# Patient Record
Sex: Female | Born: 1975 | Race: Black or African American | Hispanic: No | Marital: Single | Smoking: Never smoker
Health system: Southern US, Community
[De-identification: ages and names within clinical notes are randomized; demographics above are authoritative.]

## PROBLEM LIST (undated history)

## (undated) DIAGNOSIS — D649 Anemia, unspecified: Secondary | ICD-10-CM

## (undated) DIAGNOSIS — F419 Anxiety disorder, unspecified: Secondary | ICD-10-CM

## (undated) DIAGNOSIS — F32A Depression, unspecified: Secondary | ICD-10-CM

## (undated) DIAGNOSIS — M1990 Unspecified osteoarthritis, unspecified site: Secondary | ICD-10-CM

## (undated) DIAGNOSIS — M329 Systemic lupus erythematosus, unspecified: Secondary | ICD-10-CM

## (undated) DIAGNOSIS — F329 Major depressive disorder, single episode, unspecified: Secondary | ICD-10-CM

---

## 1997-06-30 DIAGNOSIS — M329 Systemic lupus erythematosus, unspecified: Secondary | ICD-10-CM

## 1997-06-30 HISTORY — DX: Systemic lupus erythematosus, unspecified: M32.9

## 2015-05-21 DIAGNOSIS — F419 Anxiety disorder, unspecified: Secondary | ICD-10-CM | POA: Insufficient documentation

## 2015-06-04 DIAGNOSIS — Z01411 Encounter for gynecological examination (general) (routine) with abnormal findings: Secondary | ICD-10-CM | POA: Insufficient documentation

## 2015-08-10 DIAGNOSIS — R200 Anesthesia of skin: Secondary | ICD-10-CM | POA: Insufficient documentation

## 2015-10-29 HISTORY — PX: OTHER SURGICAL HISTORY: SHX169

## 2016-02-08 DIAGNOSIS — R5382 Chronic fatigue, unspecified: Secondary | ICD-10-CM | POA: Insufficient documentation

## 2016-06-09 DIAGNOSIS — M329 Systemic lupus erythematosus, unspecified: Secondary | ICD-10-CM | POA: Insufficient documentation

## 2016-06-09 DIAGNOSIS — M5136 Other intervertebral disc degeneration, lumbar region: Secondary | ICD-10-CM | POA: Insufficient documentation

## 2016-06-09 DIAGNOSIS — I7300 Raynaud's syndrome without gangrene: Secondary | ICD-10-CM | POA: Insufficient documentation

## 2016-06-09 DIAGNOSIS — N904 Leukoplakia of vulva: Secondary | ICD-10-CM | POA: Insufficient documentation

## 2016-06-09 DIAGNOSIS — B009 Herpesviral infection, unspecified: Secondary | ICD-10-CM | POA: Insufficient documentation

## 2016-07-30 ENCOUNTER — Other Ambulatory Visit: Payer: Self-pay | Admitting: Orthopedic Surgery

## 2016-07-30 DIAGNOSIS — Q762 Congenital spondylolisthesis: Secondary | ICD-10-CM

## 2016-08-04 ENCOUNTER — Other Ambulatory Visit: Payer: Self-pay

## 2016-08-06 ENCOUNTER — Ambulatory Visit
Admission: RE | Admit: 2016-08-06 | Discharge: 2016-08-06 | Disposition: A | Discharge status: Home / Self Care | Payer: Medicare Other | Source: Ambulatory Visit | Attending: Orthopedic Surgery | Admitting: Orthopedic Surgery

## 2016-08-06 DIAGNOSIS — Q762 Congenital spondylolisthesis: Secondary | ICD-10-CM

## 2016-08-12 ENCOUNTER — Other Ambulatory Visit: Payer: Self-pay | Admitting: Neurosurgery

## 2016-08-29 NOTE — Pre-Procedure Instructions (Signed)
    Linda Meyers  08/29/2016      [facility] [place], [place] - [address] [place] [address] Phone: [phone] Fax: [phone]    Your procedure is scheduled on Tues., Mar. 13 @ 0800 A.M.  [facility] Admitting at 0600 A.M.  Call this number if you have problems the morning of surgery:  [phone]   Remember:  Do not eat food or drink liquids after midnight.  Take these medicines the morning of surgery with A SIP OF WATER (none).  Stop taking and herbal medication, vitamins, Goody's. BCS, Advil, Aleve, Naproxen sodium.   Do not wear jewelry, make-up or nail polish.  Do not wear lotions, powders, or perfumes, or deoderant.  Do not shave 48 hours prior to surgery.    Do not bring valuables to the hospital.   [facility] is not responsible for any belongings or valuables.  Contacts, dentures or bridgework may not be worn into surgery.  Leave your suitcase in the car.  After surgery it may be brought to your room.  For patients admitted to the hospital, discharge time will be determined by your treatment team.  Patients discharged the day of surgery will not be allowed to drive home.   Special instructions:  See attached  Please read over the following fact sheets that you were given. Pain Booklet, Coughing and Deep Breathing, MRSA Information and Surgical Site Infection Prevention

## 2016-09-01 ENCOUNTER — Encounter (HOSPITAL_COMMUNITY)
Admission: RE | Admit: 2016-09-01 | Discharge: 2016-09-01 | Disposition: A | Discharge status: Home / Self Care | Payer: Medicare Other | Source: Ambulatory Visit | Attending: Neurosurgery | Admitting: Neurosurgery

## 2016-09-01 ENCOUNTER — Encounter (HOSPITAL_COMMUNITY): Payer: Self-pay

## 2016-09-01 DIAGNOSIS — Z01812 Encounter for preprocedural laboratory examination: Secondary | ICD-10-CM | POA: Insufficient documentation

## 2016-09-01 DIAGNOSIS — M4316 Spondylolisthesis, lumbar region: Secondary | ICD-10-CM | POA: Insufficient documentation

## 2016-09-01 HISTORY — DX: Depression, unspecified: F32.A

## 2016-09-01 HISTORY — DX: Anemia, unspecified: D64.9

## 2016-09-01 HISTORY — DX: Systemic lupus erythematosus, unspecified: M32.9

## 2016-09-01 HISTORY — DX: Anxiety disorder, unspecified: F41.9

## 2016-09-01 HISTORY — DX: Major depressive disorder, single episode, unspecified: F32.9

## 2016-09-01 HISTORY — DX: Unspecified osteoarthritis, unspecified site: M19.90

## 2016-09-01 LAB — CBC WITH DIFFERENTIAL/PLATELET
BASOS ABS: 0 10*3/uL (ref 0.0–0.1)
Basophils Relative: 0 %
EOS ABS: 0.1 10*3/uL (ref 0.0–0.7)
EOS PCT: 1 %
HCT: 41.5 % (ref 36.0–46.0)
Hemoglobin: 13.8 g/dL (ref 12.0–15.0)
Lymphocytes Relative: 18 %
Lymphs Abs: 0.9 10*3/uL (ref 0.7–4.0)
MCH: 29.6 pg (ref 26.0–34.0)
MCHC: 33.3 g/dL (ref 30.0–36.0)
MCV: 88.9 fL (ref 78.0–100.0)
Monocytes Absolute: 0.7 10*3/uL (ref 0.1–1.0)
Monocytes Relative: 14 %
Neutro Abs: 3.3 10*3/uL (ref 1.7–7.7)
Neutrophils Relative %: 67 %
PLATELETS: 359 10*3/uL (ref 150–400)
RBC: 4.67 MIL/uL (ref 3.87–5.11)
RDW: 14.2 % (ref 11.5–15.5)
WBC: 4.9 10*3/uL (ref 4.0–10.5)

## 2016-09-01 LAB — HCG, SERUM, QUALITATIVE: Preg, Serum: NEGATIVE

## 2016-09-01 LAB — TYPE AND SCREEN
ABO/RH(D): O POS
Antibody Screen: NEGATIVE

## 2016-09-01 LAB — SURGICAL PCR SCREEN
MRSA, PCR: NEGATIVE
Staphylococcus aureus: NEGATIVE

## 2016-09-01 LAB — ABO/RH: ABO/RH(D): O POS

## 2016-09-01 MED ORDER — CHLORHEXIDINE GLUCONATE CLOTH 2 % EX PADS: 6.0000 | MEDICATED_PAD | Freq: Once | CUTANEOUS | Status: DC

## 2016-09-01 NOTE — Progress Notes (Addendum)
PCP: Deneen HartsElizabeth Todd. PA-C  Cardiologist: pt denies  [facility] 08[facility]27/16 [facility]   Stress test: pt denies ever  ECHO: pt denies ever  Cardiac Cath: pt denies  Chest x-ray: pt denies past year

## 2016-09-04 DIAGNOSIS — D071 Carcinoma in situ of vulva: Secondary | ICD-10-CM | POA: Insufficient documentation

## 2016-09-04 DIAGNOSIS — Z87410 Personal history of cervical dysplasia: Secondary | ICD-10-CM | POA: Insufficient documentation

## 2016-09-08 NOTE — Anesthesia Preprocedure Evaluation (Addendum)
Anesthesia Evaluation    Airway Mallampati: II       Dental  (+) Teeth Intact   Pulmonary    breath sounds clear to auscultation       Cardiovascular  Rhythm:Regular Rate:Normal     Neuro/Psych PSYCHIATRIC DISORDERS Anxiety    GI/Hepatic   Endo/Other    Renal/GU      Musculoskeletal  (+) Arthritis ,   Abdominal   Peds  Hematology negative hematology ROS (+) anemia ,   Anesthesia Other Findings   Reproductive/Obstetrics                            Anesthesia Physical Anesthesia Plan  ASA: II  Anesthesia Plan: General   Post-op Pain Management:    Induction: Intravenous  Airway Management Planned: Oral ETT  Additional Equipment:   Intra-op Plan:   Post-operative Plan: Extubation in OR  Informed Consent: I have reviewed the patients History and Physical, chart, labs and discussed the procedure including the risks, benefits and alternatives for the proposed anesthesia with the patient or authorized representative who has indicated his/her understanding and acceptance.   Dental advisory given  Plan Discussed with: CRNA  Anesthesia Plan Comments: (Lupus (SLE))       Anesthesia Quick Evaluation

## 2016-09-09 ENCOUNTER — Inpatient Hospital Stay (HOSPITAL_COMMUNITY): Payer: Medicare Other

## 2016-09-09 ENCOUNTER — Inpatient Hospital Stay (HOSPITAL_COMMUNITY): Payer: Medicare Other | Admitting: Certified Registered Nurse Anesthetist

## 2016-09-09 ENCOUNTER — Inpatient Hospital Stay (HOSPITAL_COMMUNITY)
Admission: RE | Admit: 2016-09-09 | Discharge: 2016-09-10 | DRG: 455 | Disposition: A | Discharge status: Home / Self Care | Payer: Medicare Other | Source: Ambulatory Visit | Attending: Neurosurgery | Admitting: Neurosurgery

## 2016-09-09 ENCOUNTER — Encounter (HOSPITAL_COMMUNITY): Payer: Self-pay | Admitting: *Deleted

## 2016-09-09 ENCOUNTER — Encounter (HOSPITAL_COMMUNITY)
Admission: RE | Disposition: A | Discharge status: Home / Self Care | Payer: Self-pay | Source: Ambulatory Visit | Attending: Neurosurgery

## 2016-09-09 DIAGNOSIS — M5136 Other intervertebral disc degeneration, lumbar region: Secondary | ICD-10-CM | POA: Diagnosis present

## 2016-09-09 DIAGNOSIS — Z885 Allergy status to narcotic agent status: Secondary | ICD-10-CM

## 2016-09-09 DIAGNOSIS — M4807 Spinal stenosis, lumbosacral region: Secondary | ICD-10-CM | POA: Diagnosis present

## 2016-09-09 DIAGNOSIS — M4310 Spondylolisthesis, site unspecified: Secondary | ICD-10-CM | POA: Diagnosis present

## 2016-09-09 DIAGNOSIS — M4317 Spondylolisthesis, lumbosacral region: Secondary | ICD-10-CM | POA: Diagnosis present

## 2016-09-09 DIAGNOSIS — Z79899 Other long term (current) drug therapy: Secondary | ICD-10-CM | POA: Diagnosis not present

## 2016-09-09 DIAGNOSIS — M7130 Other bursal cyst, unspecified site: Secondary | ICD-10-CM | POA: Diagnosis present

## 2016-09-09 DIAGNOSIS — M329 Systemic lupus erythematosus, unspecified: Secondary | ICD-10-CM | POA: Diagnosis present

## 2016-09-09 DIAGNOSIS — Z888 Allergy status to other drugs, medicaments and biological substances status: Secondary | ICD-10-CM | POA: Diagnosis not present

## 2016-09-09 DIAGNOSIS — M4697 Unspecified inflammatory spondylopathy, lumbosacral region: Secondary | ICD-10-CM | POA: Diagnosis present

## 2016-09-09 DIAGNOSIS — M5126 Other intervertebral disc displacement, lumbar region: Secondary | ICD-10-CM | POA: Diagnosis present

## 2016-09-09 SURGERY — POSTERIOR LUMBAR FUSION 2 LEVEL
Anesthesia: General | Site: Back

## 2016-09-09 MED ORDER — LIDOCAINE HCL (CARDIAC) 20 MG/ML IV SOLN
INTRAVENOUS | Status: DC | PRN
  Administered 2016-09-09: 80 mg via INTRAVENOUS

## 2016-09-09 MED ORDER — LACTATED RINGERS IV SOLN
INTRAVENOUS | Status: DC | PRN
  Administered 2016-09-09 (×3): via INTRAVENOUS

## 2016-09-09 MED ORDER — THROMBIN 20000 UNITS EX SOLR
CUTANEOUS | Status: AC
  Filled 2016-09-09: qty 20000

## 2016-09-09 MED ORDER — CEFAZOLIN IN D5W 1 GM/50ML IV SOLN
1.0000 g | Freq: Three times a day (TID) | INTRAVENOUS | Status: AC
  Administered 2016-09-09 (×2): 1 g via INTRAVENOUS
  Filled 2016-09-09 (×2): qty 50

## 2016-09-09 MED ORDER — FENTANYL CITRATE (PF) 100 MCG/2ML IJ SOLN
INTRAMUSCULAR | Status: AC
  Filled 2016-09-09: qty 2

## 2016-09-09 MED ORDER — CLONAZEPAM 0.5 MG PO TABS: 1.0000 mg | ORAL_TABLET | Freq: Every evening | ORAL | Status: DC | PRN

## 2016-09-09 MED ORDER — HYDROMORPHONE HCL 2 MG PO TABS
2.0000 mg | ORAL_TABLET | ORAL | Status: DC | PRN
  Administered 2016-09-09 – 2016-09-10 (×2): 2 mg via ORAL
  Filled 2016-09-09 (×2): qty 1

## 2016-09-09 MED ORDER — PROMETHAZINE HCL 25 MG/ML IJ SOLN: 12.5000 mg | Freq: Once | INTRAMUSCULAR | Status: DC | PRN

## 2016-09-09 MED ORDER — ONDANSETRON HCL 4 MG/2ML IJ SOLN
4.0000 mg | Freq: Four times a day (QID) | INTRAMUSCULAR | Status: DC | PRN
  Administered 2016-09-09: 4 mg via INTRAVENOUS
  Filled 2016-09-09: qty 2

## 2016-09-09 MED ORDER — SUGAMMADEX SODIUM 200 MG/2ML IV SOLN
INTRAVENOUS | Status: DC | PRN
  Administered 2016-09-09: 200 mg via INTRAVENOUS

## 2016-09-09 MED ORDER — PROPOFOL 10 MG/ML IV BOLUS
INTRAVENOUS | Status: DC | PRN
  Administered 2016-09-09: 60 mg via INTRAVENOUS
  Administered 2016-09-09: 140 mg via INTRAVENOUS

## 2016-09-09 MED ORDER — SURGIFOAM 100 EX MISC
CUTANEOUS | Status: DC | PRN
  Administered 2016-09-09 (×2): via TOPICAL

## 2016-09-09 MED ORDER — MENTHOL 3 MG MT LOZG: 1.0000 | LOZENGE | OROMUCOSAL | Status: DC | PRN

## 2016-09-09 MED ORDER — VITAMIN A 8000 UNITS PO CAPS: 8000.0000 [IU] | ORAL_CAPSULE | Freq: Every evening | ORAL | Status: DC

## 2016-09-09 MED ORDER — ROCURONIUM BROMIDE 100 MG/10ML IV SOLN
INTRAVENOUS | Status: DC | PRN
  Administered 2016-09-09: 10 mg via INTRAVENOUS
  Administered 2016-09-09: 20 mg via INTRAVENOUS
  Administered 2016-09-09: 40 mg via INTRAVENOUS
  Administered 2016-09-09: 20 mg via INTRAVENOUS
  Administered 2016-09-09: 10 mg via INTRAVENOUS

## 2016-09-09 MED ORDER — HYDROMORPHONE HCL 1 MG/ML IJ SOLN
0.5000 mg | INTRAMUSCULAR | Status: DC | PRN
  Administered 2016-09-09: 1 mg via INTRAVENOUS
  Filled 2016-09-09: qty 1

## 2016-09-09 MED ORDER — PHENOL 1.4 % MT LIQD: 1.0000 | OROMUCOSAL | Status: DC | PRN

## 2016-09-09 MED ORDER — SODIUM CHLORIDE 0.9 % IJ SOLN
INTRAMUSCULAR | Status: AC
  Filled 2016-09-09: qty 10

## 2016-09-09 MED ORDER — VANCOMYCIN HCL 1000 MG IV SOLR
INTRAVENOUS | Status: AC
  Filled 2016-09-09: qty 1000

## 2016-09-09 MED ORDER — SODIUM CHLORIDE 0.9 % IV SOLN: 250.0000 mL | INTRAVENOUS | Status: DC

## 2016-09-09 MED ORDER — GRAPESEED EXTRACT 500-50 MG PO CAPS: 500.0000 mg | ORAL_CAPSULE | Freq: Every evening | ORAL | Status: DC

## 2016-09-09 MED ORDER — CEFAZOLIN SODIUM-DEXTROSE 2-4 GM/100ML-% IV SOLN
2.0000 g | INTRAVENOUS | Status: AC
  Administered 2016-09-09: 2 g via INTRAVENOUS
  Filled 2016-09-09: qty 100

## 2016-09-09 MED ORDER — IMIQUIMOD 5 % EX CREA: 1.0000 "application " | TOPICAL_CREAM | CUTANEOUS | Status: DC

## 2016-09-09 MED ORDER — ONDANSETRON HCL 4 MG/2ML IJ SOLN
INTRAMUSCULAR | Status: DC | PRN
  Administered 2016-09-09: 4 mg via INTRAVENOUS

## 2016-09-09 MED ORDER — SUCCINYLCHOLINE CHLORIDE 200 MG/10ML IV SOSY
PREFILLED_SYRINGE | INTRAVENOUS | Status: AC
  Filled 2016-09-09: qty 10

## 2016-09-09 MED ORDER — VITAMIN D 1000 UNITS PO TABS
1000.0000 [IU] | ORAL_TABLET | Freq: Every evening | ORAL | Status: DC
  Administered 2016-09-09: 1000 [IU] via ORAL
  Filled 2016-09-09: qty 1

## 2016-09-09 MED ORDER — VECURONIUM BROMIDE 10 MG IV SOLR
INTRAVENOUS | Status: DC | PRN
  Administered 2016-09-09: 1 mg via INTRAVENOUS

## 2016-09-09 MED ORDER — POLYVINYL ALCOHOL 1.4 % OP SOLN
1.0000 [drp] | Freq: Three times a day (TID) | OPHTHALMIC | Status: DC | PRN
  Filled 2016-09-09: qty 15

## 2016-09-09 MED ORDER — MIDAZOLAM HCL 2 MG/2ML IJ SOLN
INTRAMUSCULAR | Status: AC
  Filled 2016-09-09: qty 2

## 2016-09-09 MED ORDER — ONDANSETRON HCL 4 MG PO TABS: 4.0000 mg | ORAL_TABLET | Freq: Four times a day (QID) | ORAL | Status: DC | PRN

## 2016-09-09 MED ORDER — DIAZEPAM 5 MG PO TABS: 5.0000 mg | ORAL_TABLET | Freq: Four times a day (QID) | ORAL | Status: DC | PRN

## 2016-09-09 MED ORDER — MIDAZOLAM HCL 5 MG/5ML IJ SOLN
INTRAMUSCULAR | Status: DC | PRN
  Administered 2016-09-09: 2 mg via INTRAVENOUS

## 2016-09-09 MED ORDER — BUPIVACAINE HCL (PF) 0.25 % IJ SOLN
INTRAMUSCULAR | Status: DC | PRN
  Administered 2016-09-09: 20 mL

## 2016-09-09 MED ORDER — 0.9 % SODIUM CHLORIDE (POUR BTL) OPTIME
TOPICAL | Status: DC | PRN
  Administered 2016-09-09: 1000 mL

## 2016-09-09 MED ORDER — EPHEDRINE 5 MG/ML INJ
INTRAVENOUS | Status: AC
  Filled 2016-09-09: qty 10

## 2016-09-09 MED ORDER — MEPERIDINE HCL 25 MG/ML IJ SOLN: 6.2500 mg | INTRAMUSCULAR | Status: DC | PRN

## 2016-09-09 MED ORDER — COQ10 100 MG PO CAPS
100.0000 mg | ORAL_CAPSULE | Freq: Every evening | ORAL | Status: DC
Start: 2016-09-09 — End: 2016-09-09

## 2016-09-09 MED ORDER — SODIUM CHLORIDE 0.9% FLUSH
3.0000 mL | Freq: Two times a day (BID) | INTRAVENOUS | Status: DC
  Administered 2016-09-09: 3 mL via INTRAVENOUS

## 2016-09-09 MED ORDER — VITAMIN C 500 MG PO TABS
500.0000 mg | ORAL_TABLET | Freq: Every evening | ORAL | Status: DC
  Administered 2016-09-09: 500 mg via ORAL
  Filled 2016-09-09 (×2): qty 1

## 2016-09-09 MED ORDER — PROPOFOL 10 MG/ML IV BOLUS
INTRAVENOUS | Status: AC
  Filled 2016-09-09: qty 20

## 2016-09-09 MED ORDER — VECURONIUM BROMIDE 10 MG IV SOLR
INTRAVENOUS | Status: AC
  Filled 2016-09-09: qty 10

## 2016-09-09 MED ORDER — BUPIVACAINE HCL (PF) 0.25 % IJ SOLN
INTRAMUSCULAR | Status: AC
  Filled 2016-09-09: qty 30

## 2016-09-09 MED ORDER — NAPROXEN 250 MG PO TABS: 500.0000 mg | ORAL_TABLET | Freq: Every evening | ORAL | Status: DC | PRN

## 2016-09-09 MED ORDER — DEXAMETHASONE SODIUM PHOSPHATE 10 MG/ML IJ SOLN
10.0000 mg | INTRAMUSCULAR | Status: AC
  Administered 2016-09-09: 10 mg via INTRAVENOUS
  Filled 2016-09-09: qty 1

## 2016-09-09 MED ORDER — FENTANYL CITRATE (PF) 100 MCG/2ML IJ SOLN
25.0000 ug | INTRAMUSCULAR | Status: DC | PRN
  Administered 2016-09-09 (×3): 50 ug via INTRAVENOUS

## 2016-09-09 MED ORDER — FENTANYL CITRATE (PF) 100 MCG/2ML IJ SOLN
INTRAMUSCULAR | Status: DC | PRN
  Administered 2016-09-09 (×4): 50 ug via INTRAVENOUS
  Administered 2016-09-09: 100 ug via INTRAVENOUS
  Administered 2016-09-09 (×2): 50 ug via INTRAVENOUS

## 2016-09-09 MED ORDER — MIDAZOLAM HCL 2 MG/2ML IJ SOLN: 0.5000 mg | Freq: Once | INTRAMUSCULAR | Status: DC | PRN

## 2016-09-09 MED ORDER — NAPROXEN SODIUM 550 MG PO TABS
550.0000 mg | ORAL_TABLET | Freq: Every evening | ORAL | Status: DC | PRN
  Filled 2016-09-09: qty 1

## 2016-09-09 MED ORDER — FENTANYL CITRATE (PF) 100 MCG/2ML IJ SOLN
INTRAMUSCULAR | Status: AC
  Filled 2016-09-09: qty 4

## 2016-09-09 MED ORDER — FOLIC ACID 1 MG PO TABS
1.0000 mg | ORAL_TABLET | Freq: Every evening | ORAL | Status: DC
  Administered 2016-09-09: 1 mg via ORAL
  Filled 2016-09-09: qty 1

## 2016-09-09 MED ORDER — LACTATED RINGERS IV SOLN: INTRAVENOUS | Status: DC

## 2016-09-09 MED ORDER — SODIUM CHLORIDE 0.9% FLUSH: 3.0000 mL | INTRAVENOUS | Status: DC | PRN

## 2016-09-09 MED ORDER — ONDANSETRON HCL 4 MG/2ML IJ SOLN
INTRAMUSCULAR | Status: AC
  Filled 2016-09-09: qty 2

## 2016-09-09 MED ORDER — ADULT MULTIVITAMIN W/MINERALS CH
1.0000 | ORAL_TABLET | Freq: Every evening | ORAL | Status: DC
  Administered 2016-09-09: 1 via ORAL
  Filled 2016-09-09: qty 1

## 2016-09-09 MED ORDER — HYDROXYZINE HCL 50 MG/ML IM SOLN
25.0000 mg | Freq: Four times a day (QID) | INTRAMUSCULAR | Status: DC | PRN
  Administered 2016-09-09: 25 mg via INTRAMUSCULAR
  Filled 2016-09-09: qty 1

## 2016-09-09 MED ORDER — FLAXSEED OIL 1000 MG PO CAPS: 1000.0000 mg | ORAL_CAPSULE | Freq: Every evening | ORAL | Status: DC

## 2016-09-09 MED ORDER — SODIUM CHLORIDE 0.9 % IR SOLN
Status: DC | PRN
  Administered 2016-09-09: 09:00:00

## 2016-09-09 MED FILL — Sodium Chloride IV Soln 0.9%: INTRAVENOUS | Qty: 1000 | Status: AC

## 2016-09-09 MED FILL — Heparin Sodium (Porcine) Inj 1000 Unit/ML: INTRAMUSCULAR | Qty: 30 | Status: AC

## 2016-09-09 SURGICAL SUPPLY — 66 items
BAG DECANTER FOR FLEXI CONT (MISCELLANEOUS) ×2 IMPLANT
BENZOIN TINCTURE PRP APPL 2/3 (GAUZE/BANDAGES/DRESSINGS) ×2 IMPLANT
BLADE CLIPPER SURG (BLADE) IMPLANT
BUR CUTTER 7.0 ROUND (BURR) IMPLANT
BUR MATCHSTICK NEURO 3.0 LAGG (BURR) ×2 IMPLANT
CANISTER SUCT 3000ML PPV (MISCELLANEOUS) ×2 IMPLANT
CAP LCK SPNE (Orthopedic Implant) ×6 IMPLANT
CAP LOCK SPINE RADIUS (Orthopedic Implant) ×6 IMPLANT
CAP LOCKING (Orthopedic Implant) ×6 IMPLANT
CARTRIDGE OIL MAESTRO DRILL (MISCELLANEOUS) ×1 IMPLANT
CONT SPEC 4OZ CLIKSEAL STRL BL (MISCELLANEOUS) ×2 IMPLANT
COVER BACK TABLE 60X90IN (DRAPES) ×2 IMPLANT
DECANTER SPIKE VIAL GLASS SM (MISCELLANEOUS) ×2 IMPLANT
DERMABOND ADVANCED (GAUZE/BANDAGES/DRESSINGS) ×1
DERMABOND ADVANCED .7 DNX12 (GAUZE/BANDAGES/DRESSINGS) ×1 IMPLANT
DEVICE INTERBODY ELEVATE 23X8 (Cage) ×4 IMPLANT
DIFFUSER DRILL AIR PNEUMATIC (MISCELLANEOUS) ×2 IMPLANT
DRAPE C-ARM 42X72 X-RAY (DRAPES) ×4 IMPLANT
DRAPE HALF SHEET 40X57 (DRAPES) IMPLANT
DRAPE LAPAROTOMY 100X72X124 (DRAPES) ×2 IMPLANT
DRAPE POUCH INSTRU U-SHP 10X18 (DRAPES) ×2 IMPLANT
DRAPE SURG 17X23 STRL (DRAPES) ×8 IMPLANT
DRSG OPSITE POSTOP 4X8 (GAUZE/BANDAGES/DRESSINGS) ×2 IMPLANT
DURAPREP 26ML APPLICATOR (WOUND CARE) ×2 IMPLANT
ELECT REM PT RETURN 9FT ADLT (ELECTROSURGICAL) ×2
ELECTRODE REM PT RTRN 9FT ADLT (ELECTROSURGICAL) ×1 IMPLANT
EVACUATOR 1/8 PVC DRAIN (DRAIN) ×2 IMPLANT
GAUZE SPONGE 4X4 12PLY STRL (GAUZE/BANDAGES/DRESSINGS) ×2 IMPLANT
GAUZE SPONGE 4X4 16PLY XRAY LF (GAUZE/BANDAGES/DRESSINGS) IMPLANT
GLOVE ECLIPSE 9.0 STRL (GLOVE) ×4 IMPLANT
GLOVE EXAM NITRILE LRG STRL (GLOVE) IMPLANT
GLOVE EXAM NITRILE XL STR (GLOVE) IMPLANT
GLOVE EXAM NITRILE XS STR PU (GLOVE) IMPLANT
GOWN STRL REUS W/ TWL LRG LVL3 (GOWN DISPOSABLE) IMPLANT
GOWN STRL REUS W/ TWL XL LVL3 (GOWN DISPOSABLE) ×4 IMPLANT
GOWN STRL REUS W/TWL 2XL LVL3 (GOWN DISPOSABLE) IMPLANT
GOWN STRL REUS W/TWL LRG LVL3 (GOWN DISPOSABLE)
GOWN STRL REUS W/TWL XL LVL3 (GOWN DISPOSABLE) ×4
KIT BASIN OR (CUSTOM PROCEDURE TRAY) ×2 IMPLANT
KIT ROOM TURNOVER OR (KITS) ×2 IMPLANT
NEEDLE HYPO 22GX1.5 SAFETY (NEEDLE) ×2 IMPLANT
NS IRRIG 1000ML POUR BTL (IV SOLUTION) ×2 IMPLANT
OIL CARTRIDGE MAESTRO DRILL (MISCELLANEOUS) ×2
PACK LAMINECTOMY NEURO (CUSTOM PROCEDURE TRAY) ×2 IMPLANT
ROD 70MM (Rod) ×1 IMPLANT
ROD 80MM (Rod) ×1 IMPLANT
ROD SPNL 70X5.5XNS TI RDS (Rod) ×1 IMPLANT
ROD SPNL 80X5.5XNS TI RDS (Rod) ×1 IMPLANT
SCREW 5.75X40M (Screw) ×4 IMPLANT
SCREW 5.75X45MM (Screw) ×4 IMPLANT
SCREW 6.75X40MM (Screw) ×4 IMPLANT
SPACER SPNL STD 23X8XSTRL (Cage) ×2 IMPLANT
SPACER SPNL XLORDOTIC 23X8X (Cage) ×2 IMPLANT
SPCR SPNL STD 23X8XSTRL (Cage) ×2 IMPLANT
SPCR SPNL XLORDOTIC 23X8X (Cage) ×2 IMPLANT
SPONGE LAP 4X18 X RAY DECT (DISPOSABLE) ×2 IMPLANT
SPONGE SURGIFOAM ABS GEL 100 (HEMOSTASIS) ×2 IMPLANT
STRIP CLOSURE SKIN 1/2X4 (GAUZE/BANDAGES/DRESSINGS) ×2 IMPLANT
SUT VIC AB 0 CT1 18XCR BRD8 (SUTURE) ×1 IMPLANT
SUT VIC AB 0 CT1 8-18 (SUTURE) ×1
SUT VIC AB 2-0 CT1 18 (SUTURE) ×2 IMPLANT
SUT VIC AB 3-0 SH 8-18 (SUTURE) ×4 IMPLANT
TOWEL GREEN STERILE (TOWEL DISPOSABLE) ×2 IMPLANT
TOWEL GREEN STERILE FF (TOWEL DISPOSABLE) ×2 IMPLANT
TRAY FOLEY W/METER SILVER 16FR (SET/KITS/TRAYS/PACK) ×2 IMPLANT
WATER STERILE IRR 1000ML POUR (IV SOLUTION) ×2 IMPLANT

## 2016-09-09 NOTE — H&P (Signed)
Linda Meyers is an 41 y.o. female.   Chief Complaint: Pain HPI: 41 year old female with progressively worsening lower back pain with radiation of both lower extremities right greater than left. She has increasing pain with standing or ambulating. She is failed conservative management. Workup demonstrates evidence of marked disc degeneration with severe facet arthropathy and early grade 1 degenerative spondylolisthesis at L5-S1 with resultant stenosis. At L4-5 she has disc degeneration with a central annular tear. Patient presents now for two-level lumbar decompression infusion in hopes of improving her symptoms.  Past Medical History:  Diagnosis Date  . Anemia   . Anxiety   . Arthritis   . Depression   . Lupus (systemic lupus erythematosus) (HCC) 1999    Past Surgical History:  Procedure Laterality Date  . vulva vaporization  10/2015    History reviewed. No pertinent family history. Social History:  reports that she has never smoked. She has never used smokeless tobacco. She reports that she drinks alcohol. She reports that she uses drugs, including Marijuana, about 7 times per week.  Allergies:  Allergies  Allergen Reactions  . Lortab [Hydrocodone-Acetaminophen] Itching and Nausea And Vomiting    "NO OPOID"  . Percocet [Oxycodone-Acetaminophen] Nausea And Vomiting    "NO OPOID"  . Propoxyphene Nausea And Vomiting    Medications Prior to Admission  Medication Sig Dispense Refill  . cholecalciferol (VITAMIN D) 1000 units tablet Take 1,000 Units by mouth every evening.    . clonazePAM (KLONOPIN) 1 MG tablet Take 1 tablet by mouth at bedtime as needed. For heightened anxiety at night to induce sleep.    . Coenzyme Q10 (COQ10) 100 MG CAPS Take 100 mg by mouth every evening.    . diphenhydramine-acetaminophen (TYLENOL PM) 25-500 MG TABS tablet Take 1 tablet by mouth at bedtime as needed (for pain).    . Flaxseed, Linseed, (FLAXSEED OIL) 1000 MG CAPS Take 1,000 mg by mouth every  evening.    . folic acid (FOLVITE) 400 MCG tablet Take 400 mcg by mouth every evening.    . imiquimod (ALDARA) 5 % cream Apply 1 application topically every other day.    . Multiple Vitamins-Minerals (ANTIOXIDANT VITAMIN/MINERAL) CAPS Take 1 capsule by mouth every evening.    . naproxen sodium (ANAPROX) 220 MG tablet Take 440 mg by mouth at bedtime as needed (for pain).    . Nutritional Supplements (GRAPESEED EXTRACT) 500-50 MG CAPS Take 500 mg by mouth every evening.    . Omega-3 Fatty Acids (FISH OIL) 1000 MG CAPS Take 1,000 mg by mouth every evening.    . polyvinyl alcohol (LUBRICANT DROPS) 1.4 % ophthalmic solution Place 1-2 drops into both eyes 3 (three) times daily as needed for dry eyes.    . vitamin A 8000 UNIT capsule Take 8,000 Units by mouth every evening.    . vitamin C (ASCORBIC ACID) 500 MG tablet Take 500 mg by mouth every evening.    Marland Kitchen VALTREX 1 g tablet Take 1 g by mouth daily as needed. For outbreaks.      No results found for this or any previous visit (from the past 48 hour(s)). No results found.  Pertinent items noted in HPI and remainder of comprehensive ROS otherwise negative.  Blood pressure 114/63, pulse 73, temperature 98.3 F (36.8 C), temperature source Oral, resp. rate 18, height 5\' 8"  (1.727 m), weight 73.6 kg (162 lb 5 oz), last menstrual period 08/25/2016, SpO2 98 %.  Patient is awake and alert. Oriented and appropriate. Speech is fluent.  Judgment and insight are intact. Cranial nerve function normal bilaterally. Motor and sensory function extremities normal. Gait antalgic. Posture flexed. Straight leg raising mildly positive on right. Reflexes normal except right Achilles absent and left Achilles diminished. Examination head ears eyes and throat is unremarkable chest and abdomen are benign. Extremities are free from injury deformity. 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 L5-S1 grade 1 degenerative  spondylolisthesis with severe stenosis and marked facet arthropathy. Plan bilateral L4-5 and L5-S1 decompressive laminotomies and foraminotomies followed by posterior lumbar my fusion utilizing interbody expandable cages, locally harvested autograft, and augmented with posterior lateral arthrodesis utilizing segmental pedicle screw fixation and local autografting. Risks metaphyseal been explained. Patient wishes to proceed.  Kyng Matlock A 09/09/2016, 7:52 AM

## 2016-09-09 NOTE — Transfer of Care (Signed)
Immediate Anesthesia Transfer of Care Note  Patient: Linda Meyers  Procedure(s) Performed: Procedure(s) with comments: Posterior Lumbar Interbody Fusion - Lumbar four -Lumbar five - Lumbar five-Sacral one (N/A) - Posterior Lumbar Interbody Fusion - Lumbar four -Lumbar five - Lumbar five-Sacral one  Patient Location: PACU  Anesthesia Type:General  Level of Consciousness: awake, alert  and oriented  Airway & Oxygen Therapy: Patient Spontanous Breathing and Patient connected to face mask oxygen  Post-op Assessment: Report given to RN, Post -op Vital signs reviewed and stable and Patient moving all extremities X 4  Post vital signs: Reviewed and stable  Last Vitals:  Vitals:   09/09/16 0618  BP: 114/63  Pulse: 73  Resp: 18  Temp: 36.8 C    Last Pain:  Vitals:   09/09/16 0618  TempSrc: Oral         Complications: No apparent anesthesia complications

## 2016-09-09 NOTE — Brief Op Note (Signed)
09/09/2016  11:52 AM  PATIENT:  Linda Meyers  41 y.o. female  PRE-OPERATIVE DIAGNOSIS:  Degenerative spondylolisthesis  POST-OPERATIVE DIAGNOSIS:  Degenerative spondylolisthesis  PROCEDURE:  Procedure(s) with comments: Posterior Lumbar Interbody Fusion - Lumbar four -Lumbar five - Lumbar five-Sacral one (N/A) - Posterior Lumbar Interbody Fusion - Lumbar four -Lumbar five - Lumbar five-Sacral one  SURGEON:  Surgeon(s) and Role:    * Julio SicksHenry Bogdan Vivona, MD - Primary    * Maeola HarmanJoseph Stern, MD - Assisting  PHYSICIAN ASSISTANT:   ASSISTANTS:    ANESTHESIA:   general  EBL:  Total I/O In: 2000 [I.V.:2000] Out: 1145 [Urine:745; Blood:400]  BLOOD ADMINISTERED:none  DRAINS: none   LOCAL MEDICATIONS USED:  MARCAINE     SPECIMEN:  No Specimen  DISPOSITION OF SPECIMEN:  N/A  COUNTS:  YES  TOURNIQUET:  * No tourniquets in log *  DICTATION: .Dragon Dictation  PLAN OF CARE: Admit to inpatient   PATIENT DISPOSITION:  PACU - hemodynamically stable.   Delay start of Pharmacological VTE agent (>24hrs) due to surgical blood loss or risk of bleeding: yes

## 2016-09-09 NOTE — Op Note (Signed)
Date of procedure: 09/09/2016  Date of dictation: Same  Service: Neurosurgery  Preoperative diagnosis: L5-S1 degenerative spondylolisthesis with stenosis, L4-5 degenerative disc disease with central disc herniation  Postoperative diagnosis: Same  Procedure Name: Bilateral L4-5 and L5-S1 decompressive laminotomies and foraminotomies, more than would be required for simple interbody fusion alone.  L4-5 and L5-S1 posterior lumbar interbody fusion utilizing interbody expandable cages and locally harvested autograft  L4-5 and S1 posterior lateral arthrodesis utilizing segmental pedicle screw fixation and local autograft  Surgeon:Solon Alban A.Dajohn Ellender, M.D.  Asst. Surgeon: Venetia MaxonStern  Anesthesia: General  Indication: Patient is a 41 year old female with intractable back and lower extremity pain failing conservative management. Workup demonstrates evidence of marked disc degeneration with a central disc herniation L4-5 and severe facet arthropathy with bilateral synovial cyst and severe stenosis and an associated grade 1 degenerative spondylolisthesis at L5-S1. Patient has failed conservative management presents now for two-level lumbar decompression and fusion.  Operative note: After induction anesthesia, patient position prone onto Wilson frame appropriate padded. Lumbar region prepped and draped sterilely. Incision made overlying L4-5 and S1. Dissection performed bilaterally. Retractor placed. Fluoroscopy used. Levels confirmed. Decompressive laminotomies were performed bilaterally at L4-5 and L5-S1 removing the inferior aspect of lamina of L4 and L5 the superior aspect lamina of L5 and S1 the entire inferior facet and pars of L4 and L5 and the majority the superior facet of L5 and S1. Ligament flavum and pannus were elevated and resected. Foraminotomies were performed along the course exiting L4-5 and S1 nerve roots. Bilateral discectomies performed at L4-5 and L5-S1. Disc space prepared for interbody fusion.  With the distractor left and patient's contralateral side a 8 mm standard [employer] expandable cage packed with locally harvested autograft was then impacted into place and expanded to its full extent. The distractor was removed patient's right side. Disc space once again prepared for interbody fusion. Morselize autograft packed in the interspace and then a second cage packed with autograft was then impacted in place and expanded to its full extent. Procedure repeated L5-S1 using 8 mm extra lordotic imp   which were once again fully expanded. Pedicles of L4-5 and S1 were then applied using surface landmarks and intraoperative fluoroscopy. Superficial bone Pedicles removed using high-speed drill. Each pedicles and probed with pedicle awl each pedicle awl track was confirmed to be solidly within the bone. Each pedicle awl track was tapped and then 5.75 mm radius brand screws from [employer] were placed bilaterally at L4 and L5. 6.75 screws placed bilaterally at S1. Transverse processes and residual facets were decorticated. Morselize autograft was packed posterior laterally. Final images revealed good position of the cages and the pedicle screws at the proper upper level with normal alignment spine. Short segment titanium rod placed over the screw heads at L4-5 and S1. Locking caps placed over the screws. Locking caps engaged with the construct in compression. Gelfoam placed topically for hemostasis. Vancomycin powder placed the deep wound space. Wounds and close in layers of Vicryl sutures. Steri-Strips and sterile dressing were applied. No apparent complications. Patient tolerated the procedure well and she returns to the recovery room postop.

## 2016-09-09 NOTE — Progress Notes (Signed)
Dr. Duwaine MaxinMukesh wants HCG done today I stat order placed.

## 2016-09-09 NOTE — Anesthesia Procedure Notes (Signed)
Procedure Name: Intubation Date/Time: 09/09/2016 8:11 AM Performed by: Lanell MatarBAKER, Kerisha Goughnour M Pre-anesthesia Checklist: Patient identified, Emergency Drugs available, Suction available and Patient being monitored Patient Re-evaluated:Patient Re-evaluated prior to inductionOxygen Delivery Method: Circle System Utilized Preoxygenation: Pre-oxygenation with 100% oxygen Intubation Type: IV induction Ventilation: Mask ventilation without difficulty Laryngoscope Size: Miller and 2 Grade View: Grade I Tube type: Oral Tube size: 7.0 mm Number of attempts: 1 Airway Equipment and Method: Stylet and Oral airway Placement Confirmation: ETT inserted through vocal cords under direct vision,  positive ETCO2 and breath sounds checked- equal and bilateral Secured at: 22 cm Tube secured with: Tape Dental Injury: Teeth and Oropharynx as per pre-operative assessment

## 2016-09-10 LAB — I-STAT BETA HCG BLOOD, ED (NOT ORDERABLE): I-stat hCG, quantitative: <5 m[IU]/mL (ref <5)

## 2016-09-10 MED ORDER — DIAZEPAM 5 MG PO TABS: 5.0000 mg | ORAL_TABLET | Freq: Four times a day (QID) | ORAL | 0 refills | Status: DC | PRN

## 2016-09-10 MED ORDER — HYDROMORPHONE HCL 2 MG PO TABS: 2.0000 mg | ORAL_TABLET | ORAL | 0 refills | Status: DC | PRN

## 2016-09-10 MED FILL — Thrombin For Soln 20000 Unit: CUTANEOUS | Qty: 1 | Status: AC

## 2016-09-10 NOTE — Addendum Note (Signed)
Addendum  created 09/10/16 1357 by Arma HeadingMukesh Stephanine Reas, MD   Sign clinical note

## 2016-09-10 NOTE — Anesthesia Postprocedure Evaluation (Signed)
Anesthesia Post Note  Patient: Linda Meyers  Procedure(s) Performed: Procedure(s) (LRB): Posterior Lumbar Interbody Fusion - Lumbar four -Lumbar five - Lumbar five-Sacral one (N/A)  Patient location during evaluation: PACU Anesthesia Type: General Level of consciousness: awake Pain management: pain level controlled Vital Signs Assessment: post-procedure vital signs reviewed and stable Cardiovascular status: stable Postop Assessment: no signs of nausea or vomiting Anesthetic complications: no       Last Vitals:  Vitals:   09/10/16 0338 09/10/16 0833  BP: 94/66 109/62  Pulse: 94 85  Resp: 18 18  Temp: 37.4 C 37.2 C    Last Pain:  Vitals:   09/10/16 0833  TempSrc: Oral  PainSc:                  [name]

## 2016-09-10 NOTE — Discharge Instructions (Signed)
Spinal Fusion, Adult, Care After This sheet gives you information about how to care for yourself after your procedure. Your health care provider may also give you more specific instructions. If you have problems or questions, contact your health care provider. What can I expect after the procedure? After the procedure, it is common to have:  Back pain and stiffness.  Pain in the incision area. Follow these instructions at home: Medicines   Take over-the-counter and prescription medicines only as told by your health care provider. These include any pain medicines or blood thinning medicines (anticoagulants).  If you were prescribed an antibiotic medicine, take it as told by your health care provider. Do not stop taking the antibiotic even if you start to feel better.  Do not drive for 24 hours if you received a medicine to help you relax (sedative).  Do not drive or use heavy machinery while taking prescription pain medicine. If you have a brace:   Wear the brace as told by your health care provider. Remove it only as told by your health care provider.  Keep the brace clean. Managing pain, stiffness, and swelling   If directed, put ice on the injured area:  If you have a removable brace, remove it as told by your health care provider.  Put ice in a plastic bag.  Place a towel between your skin and the bag.  Leave the ice on for 20 minutes, 2-3 times a day. Incision care   Follow instructions from your health care provider about how to take care of your incision. Make sure you:  Wash your hands with soap and water before you change your bandage (dressing). If soap and water are not available, use hand sanitizer.  Change your dressing as told by your health care provider.  Leave stitches (sutures), skin glue, or adhesive strips in place. These skin closures may need to be in place for 2 weeks or longer. If adhesive strip edges start to loosen and curl up, you may trim the  loose edges. Do not remove adhesive strips completely unless your health care provider tells you to do that.  Keep your incision clean and dry. Do not take baths, swim, or use a hot tub until your health care provider approves.  Check your incision area every day for signs of infection. Check for:  More redness, swelling, or pain.  Fluid or blood.  Warmth.  Pus or a bad smell. Physical activity   Rest and protect your back as much as possible.  Follow instructions from your health care provider about how to move and use good posture to help your spine heal.  Do not lift anything that is heavier than 8 lb (3.6 kg) or as told by your health care provider.  Do not twist or bend at the waist until your health care provider approves.  Avoid:  Pushing and pulling motions.  Lifting anything over your head.  Sitting or lying down in the same position for long periods of time.  Do not exercise until your health care provider approves. Once your health care provider has approved exercise, ask him or her what kinds of exercises you can do to make your back stronger (physical therapy). General instructions   Wear compression stockings and walk at least every few hours as told by your health care provider. Doing this will help to prevent blood clots and reduce swelling in your legs.  Do not use any products that contain nicotine or tobacco, such  as cigarettes and e-cigarettes. These can delay bone healing. If you need help quitting, ask your health care provider.  To prevent or treat constipation while you are taking prescription pain medicine, your health care provider may recommend that you:  Drink enough fluid to keep your urine clear or pale yellow.  Take over-the-counter or prescription medicines.  Eat foods that are high in fiber, such as fresh fruits and vegetables, whole grains, and beans.  Limit foods that are high in fat and processed sugars, such as fried and sweet  foods.  Keep all follow-up visits as told by your health care provider. This is important. Contact a health care provider if:  You have pain that gets worse or does not get better with medicine.  Your legs or feet become painful or swollen.  You have more redness, swelling, or pain around your incision.  You have fluid or blood coming from your incision.  Your incision feels warm to the touch.  You have pus or a bad smell coming from your incision.  You have a fever.  You vomit or feel nausea.  You have weakness or numbness in your legs that is new or getting worse.  You have trouble controlling urination or bowel movements. Get help right away if:  You have severe pain.  You have chest pain.  You have trouble breathing.  You develop a cough. These symptoms may represent a serious problem that is an emergency. Do not wait to see if the symptoms will go away. Get medical help right away. Call your local emergency services (911 in the [place] Do not drive yourself to the hospital. Summary  It is common to have pain at the back and incision area.  Icing and pain medicines may help to control the pain. Follow directions from your health care provider.  Rest and protect your back as much as possible. Do not twist or bend at the waist. Get up and walk at least every few hours as told by your health care provider. This information is not intended to replace advice given to you by your health care provider. Make sure you discuss any questions you have with your health care provider. Document Released: 01/03/2005 Document Revised: 06/04/2016 Document Reviewed: 06/04/2016 Elsevier Interactive Patient Education  2017 [employer] Wound Care Keep incision covered and dry for two days.  If you shower, cover incision with plastic wrap.  Do not put any creams, lotions, or ointments on incision. Leave steri-strips on back.  They will fall off by themselves. Activity Walk each and  every day, increasing distance each day. No lifting greater than 5 lbs.  Avoid excessive neck motion. No driving for 2 weeks; may ride as a passenger locally. If provided with back brace, wear when out of bed.  It is not necessary to wear brace in bed. Diet Resume your normal diet.  Return to Work Will be discussed at you follow up appointment. Call Your Doctor If Any of These Occur Redness, drainage, or swelling at the wound.  Temperature greater than 101 degrees. Severe pain not relieved by pain medication. Incision starts to come apart. Follow Up Appt Call today for appointment in 1-2 weeks ([phone]) or for problems.  If you have any hardware placed in your spine, you will need an x-ray before your appointment.

## 2016-09-10 NOTE — Evaluation (Signed)
Physical Therapy Evaluation Patient Details Name: Linda Meyers MRN: [id number] DOB: 01/01/1976 Today's Date: 09/10/2016   History of Present Illness  41 y.o. female with degenerative spondylolisthesis of L4-L5 and L5-S1. S/p L4-L5 and L5-S1 laminotomies with fusion  Clinical Impression  Pt presents with decreased strength, balance and activity tolerance secondary to above. PTA pt was I with all mobility. Recommend d/c home as pt is functioning at supervision level with mobility. Pt would benefit from continued acute PT services to address balance and improve activity tolerance. Acute PT will follow as able.     Follow Up Recommendations No PT follow up    Equipment Recommendations  None recommended by PT    Recommendations for Other Services       Precautions / Restrictions Precautions Precautions: Back;Fall Precaution Booklet Issued: Yes (comment) Precaution Comments: reviewed handout and able to 100% recall precautions Required Braces or Orthoses: Spinal Brace Spinal Brace: Applied in sitting position;Thoracolumbosacral orthotic ([facility]) Restrictions Weight Bearing Restrictions: No Other Position/Activity Restrictions: Back precautions      Mobility  Bed Mobility Overal bed mobility: Needs Assistance Bed Mobility: Sidelying to Sit;Sit to Sidelying   Sidelying to sit: Supervision     Sit to sidelying: Supervision General bed mobility comments: supervision for safety, increased time, increased effort from L sidelying compared to R sidelying, increased time  Transfers Overall transfer level: Needs assistance Equipment used: None Transfers: Sit to/from Stand Sit to Stand: Supervision         General transfer comment: supervision for safety, increased time  Ambulation/Gait Ambulation/Gait assistance: Min assist Ambulation Distance (Feet): 150 Feet Assistive device: 1 person hand held assist Gait Pattern/deviations: Step-through pattern;Decreased stride  length;Trunk flexed Gait velocity: decreased Gait velocity interpretation: Below normal speed for age/gender General Gait Details: slower speed, 1HHA for pt confidence, however, not weight put through assist. able to ambulate in room confidently without HHA, increased ER of R LE  Stairs            Wheelchair Mobility    Modified Rankin (Stroke Patients Only)       Balance Overall balance assessment: Needs assistance Sitting-balance support: Feet supported;No upper extremity supported Sitting balance-Leahy Scale: Good Sitting balance - Comments: sat EOB x 4 min to don/doff brace without LOB   Standing balance support: Single extremity supported;No upper extremity supported;During functional activity Standing balance-Leahy Scale: Good Standing balance comment: initially ambulates with 1HHA due to decreased confidence, however, transitioned to no UE assist, able to stand and adjust gown without UE assist without LOB                             Pertinent Vitals/Pain Pain Assessment: 0-10 Pain Score: 6  Pain Location: incision site Pain Descriptors / Indicators: Sore Pain Intervention(s): Monitored during session;Premedicated before session;Repositioned    Home Living Family/patient expects to be discharged to:: Private residence Living Arrangements: Non-relatives/Friends Available Help at Discharge: Friend(s);Available 24 hours/day Type of Home: Apartment Home Access: Level entry     Home Layout: One level Home Equipment: Shower seat Additional Comments: requesting 3n1 for home due to low commode    Prior Function Level of Independence: Independent         Comments: was driving and previously working     Hand Dominance   Dominant Hand: Right    Extremity/Trunk Assessment   Upper Extremity Assessment Upper Extremity Assessment: Overall WFL for tasks assessed    Lower Extremity Assessment Lower Extremity Assessment:  Defer to PT evaluation     Cervical / Trunk Assessment Cervical / Trunk Assessment: Other exceptions Cervical / Trunk Exceptions: spinal surgery  Communication   Communication: No difficulties  Cognition Arousal/Alertness: Awake/alert Behavior During Therapy: WFL for tasks assessed/performed Overall Cognitive Status: Within Functional Limits for tasks assessed                      General Comments General comments (skin integrity, edema, etc.): incision dry and intact    Exercises     Assessment/Plan    PT Assessment Patient needs continued PT services  PT Problem List Decreased strength;Decreased range of motion;Decreased activity tolerance;Decreased balance;Decreased mobility;Pain       PT Treatment Interventions Gait training;Functional mobility training;Therapeutic activities;Therapeutic exercise;Patient/family education;Balance training    PT Goals (Current goals can be found in the Care Plan section)  Acute Rehab PT Goals Patient Stated Goal: to go home PT Goal Formulation: With patient Time For Goal Achievement: 09/24/16 Potential to Achieve Goals: Good    Frequency Min 5X/week   Barriers to discharge        Co-evaluation               End of Session Equipment Utilized During Treatment: Gait belt;Back brace Activity Tolerance: Patient tolerated treatment well Patient left: in bed;with call bell/phone within reach;with family/visitor present Nurse Communication: Mobility status PT Visit Diagnosis: Unsteadiness on feet (R26.81);Other abnormalities of gait and mobility (R26.89);Muscle weakness (generalized) (M62.81);Difficulty in walking, not elsewhere classified (R26.2);Pain Pain - part of body:  (back)         Time: 1610-9604 PT Time Calculation (min) (ACUTE ONLY): 20 min   Charges:   PT Evaluation $PT Eval Moderate Complexity: 1 Procedure     PT G CodesLane Hacker Sep 22, 2016, 10:42 AM   Lane Hacker, SPT Acute Rehab  SPT [phone]

## 2016-09-10 NOTE — Progress Notes (Signed)
Patient alert and oriented, mae's well, voiding adequate amount of urine, swallowing without difficulty,  c/o mild pain at time of discharge. Patient discharged home with family. Script and discharged instructions given to patient. Patient and family stated understanding of instructions given. Patient has an appointment with Dr. Pool.  

## 2016-09-10 NOTE — Anesthesia Postprocedure Evaluation (Signed)
Anesthesia Post Note  Patient: Linda Meyers  Procedure(s) Performed: Procedure(s) (LRB): Posterior Lumbar Interbody Fusion - Lumbar four -Lumbar five - Lumbar five-Sacral one (N/A)  Patient location during evaluation: PACU Anesthesia Type: General Level of consciousness: awake Pain management: pain level controlled Vital Signs Assessment: post-procedure vital signs reviewed and stable Respiratory status: spontaneous breathing Cardiovascular status: stable Postop Assessment: no signs of nausea or vomiting Anesthetic complications: no       Last Vitals:  Vitals:   09/10/16 0338 09/10/16 0833  BP: 94/66 109/62  Pulse: 94 85  Resp: 18 18  Temp: 37.4 C 37.2 C    Last Pain:  Vitals:   09/10/16 0833  TempSrc: Oral  PainSc:                  Arma HeadingMukesh Cloy Cozzens

## 2016-09-10 NOTE — Evaluation (Signed)
Occupational Therapy Evaluation Patient Details Name: Linda Meyers MRN: [id number] DOB: Sep 18, 1975 Today's Date: 09/10/2016    History of Present Illness 41 y.o. female with degenerative spondylolisthesis of L4-L5 and L5-S1. S/p L4-L5 and L5-S1 laminotomies with fusion   Clinical Impression   Patient evaluated by Occupational Therapy with no further acute OT needs identified. All education has been completed and the patient has no further questions. See below for any follow-up Occupational Therapy or equipment needs. OT to sign off. Thank you for referral.      Follow Up Recommendations  No OT follow up    Equipment Recommendations  3 in 1 bedside commode    Recommendations for Other Services       Precautions / Restrictions Precautions Precautions: Back;Fall Precaution Booklet Issued: Yes (comment) Precaution Comments: reviewed handout and able to 100% recall precautions Required Braces or Orthoses: Spinal Brace Spinal Brace: Applied in sitting position;Thoracolumbosacral orthotic ([facility]) Restrictions Weight Bearing Restrictions: No Other Position/Activity Restrictions: Back precautions      Mobility Bed Mobility Overal bed mobility: Needs Assistance Bed Mobility: Sidelying to Sit;Sit to Sidelying   Sidelying to sit: Supervision     Sit to sidelying: Supervision General bed mobility comments: supervision for safety, increased time, increased effort from L sidelying compared to R sidelying, increased time  Transfers Overall transfer level: Needs assistance Equipment used: None Transfers: Sit to/from Stand Sit to Stand: Supervision         General transfer comment: supervision for safety, increased time    Balance Overall balance assessment: Needs assistance Sitting-balance support: Feet supported;No upper extremity supported Sitting balance-Leahy Scale: Good Sitting balance - Comments: sat EOB x 4 min to don/doff brace without LOB   Standing balance  support: Single extremity supported;No upper extremity supported;During functional activity Standing balance-Leahy Scale: Good Standing balance comment: initially ambulates with 1HHA due to decreased confidence, however, transitioned to no UE assist, able to stand and adjust gown without UE assist without LOB                            ADL Overall ADL's : Independent                                       General ADL Comments: pt demonstrates tub transfer and able to cross bil LE    Back handout provided and reviewed adls in detail. Pt educated on: clothing between brace, never sleep in brace, set an alarm at night for medication, avoid sitting for long periods of time, correct bed positioning for sleeping, correct sequence for bed mobility, avoiding lifting more than 5 pounds and never wash directly over incision. All education is complete and patient indicates understanding.    Vision Baseline Vision/History: Wears glasses Wears Glasses: Reading only       Perception     Praxis      Pertinent Vitals/Pain Pain Assessment: 0-10 Pain Score: 6  Pain Location: incision site Pain Descriptors / Indicators: Sore Pain Intervention(s): Monitored during session;Premedicated before session;Repositioned     Hand Dominance Right   Extremity/Trunk Assessment Upper Extremity Assessment Upper Extremity Assessment: Overall WFL for tasks assessed   Lower Extremity Assessment Lower Extremity Assessment: Defer to PT evaluation   Cervical / Trunk Assessment Cervical / Trunk Assessment: Other exceptions Cervical / Trunk Exceptions: spinal surgery   Communication Communication Communication: No difficulties  Cognition Arousal/Alertness: Awake/alert Behavior During Therapy: WFL for tasks assessed/performed Overall Cognitive Status: Within Functional Limits for tasks assessed                     General Comments  incision dry and intact    Exercises        Shoulder Instructions      Home Living Family/patient expects to be discharged to:: Private residence Living Arrangements: Non-relatives/Friends Available Help at Discharge: Friend(s);Available 24 hours/day Type of Home: Apartment Home Access: Level entry     Home Layout: One level     Bathroom Shower/Tub: Chief Strategy OfficerTub/shower unit   Bathroom Toilet: Standard     Home Equipment: TEFL teacherhower seat   Additional Comments: requesting 3n1 for home due to low commode      Prior Functioning/Environment Level of Independence: Independent        Comments: was driving and previously working        OT Problem List:        OT Treatment/Interventions:      OT Goals(Current goals can be found in the care plan section) Acute Rehab OT Goals Patient Stated Goal: to go home  OT Frequency:     Barriers to D/C:            Co-evaluation              End of Session Equipment Utilized During Treatment: Back brace Nurse Communication: Mobility status;Precautions  Activity Tolerance: Patient tolerated treatment well Patient left: in bed;with call bell/phone within reach  OT Visit Diagnosis: Unsteadiness on feet (R26.81)                ADL either performed or assessed with clinical judgement  Time: 0901-0916 OT Time Calculation (min): 15 min Charges:  OT General Charges $OT Visit: 1 Procedure OT Evaluation $OT Eval Moderate Complexity: 1 Procedure G-Codes:      Linda FlowJones, Linda   OTR/L Pager: [phone]: [phone] Office: [phone] .   Boone MasterJones, Linda Brott B 09/10/2016, 9:57 AM

## 2016-09-10 NOTE — Discharge Summary (Signed)
Physician Discharge Summary  Patient ID: Linda Meyers MRN: [id number] DOB/AGE: February 01, 1976 41 y.o.  Admit date: 09/09/2016 Discharge date: 09/10/2016  Admission Diagnoses:  Discharge Diagnoses:  Active Problems:   Degenerative spondylolisthesis   Discharged Condition: good  Hospital Course: Patient admitted to hospital where she underwent uncompensated two-level lumbar decompression infusion. Postoperative she is doing very well. Preoperative back and lower extremity pain much improved. Patient standing and ambulate without difficulty. Ready for discharge home.  Consults:   Significant Diagnostic Studies:   Treatments:   Discharge Exam: Blood pressure 109/62, pulse 85, temperature 99 F (37.2 C), temperature source Oral, resp. rate 18, height 5\' 8"  (1.727 m), weight 73.6 kg (162 lb 5 oz), last menstrual period 08/25/2016, SpO2 97 %. Awake and alert. Oriented and appropriate. Cranial nerve function intact. Motor sensory function is intact bilaterally. Wound clean and dry. Chest and abdomen benign.  Disposition: Final discharge disposition not confirmed   Allergies as of 09/10/2016      Reactions   Lortab [hydrocodone-acetaminophen] Itching, Nausea And Vomiting   "NO OPOID"   Percocet [oxycodone-acetaminophen] Nausea And Vomiting   "NO OPOID"   Propoxyphene Nausea And Vomiting      Medication List    TAKE these medications   ANTIOXIDANT VITAMIN/MINERAL Caps Take 1 capsule by mouth every evening.   cholecalciferol 1000 units tablet Commonly known as:  VITAMIN D Take 1,000 Units by mouth every evening.   clonazePAM 1 MG tablet Commonly known as:  KLONOPIN Take 1 tablet by mouth at bedtime as needed. For heightened anxiety at night to induce sleep.   CoQ10 100 MG Caps Take 100 mg by mouth every evening.   diazepam 5 MG tablet Commonly known as:  VALIUM Take 1 tablet (5 mg total) by mouth every 6 (six) hours as needed for muscle spasms.    diphenhydramine-acetaminophen 25-500 MG Tabs tablet Commonly known as:  TYLENOL PM Take 1 tablet by mouth at bedtime as needed (for pain).   Fish Oil 1000 MG Caps Take 1,000 mg by mouth every evening.   Flaxseed Oil 1000 MG Caps Take 1,000 mg by mouth every evening.   folic acid 400 MCG tablet Commonly known as:  FOLVITE Take 400 mcg by mouth every evening.   Grapeseed Extract 500-50 MG Caps Take 500 mg by mouth every evening.   HYDROmorphone 2 MG tablet Commonly known as:  DILAUDID Take 1 tablet (2 mg total) by mouth every 4 (four) hours as needed for severe pain.   imiquimod 5 % cream Commonly known as:  ALDARA Apply 1 application topically every other day.   LUBRICANT DROPS 1.4 % ophthalmic solution Generic drug:  polyvinyl alcohol Place 1-2 drops into both eyes 3 (three) times daily as needed for dry eyes.   naproxen sodium 220 MG tablet Commonly known as:  ANAPROX Take 440 mg by mouth at bedtime as needed (for pain).   VALTREX 1000 MG tablet Generic drug:  valACYclovir Take 1 g by mouth daily as needed. For outbreaks.   vitamin A 8000 UNIT capsule Take 8,000 Units by mouth every evening.   vitamin C 500 MG tablet Commonly known as:  ASCORBIC ACID Take 500 mg by mouth every evening.            Durable Medical Equipment        Start     Ordered   09/09/16 1329  DME Walker rolling  Once    Question:  Patient needs a walker to treat with the following  condition  Answer:  Degenerative spondylolisthesis   09/09/16 1328   09/09/16 1329  DME 3 n 1  Once     09/09/16 1328     Follow-up Information    Lochlann Mastrangelo A, MD Follow up.   Specialty:  Neurosurgery Contact information: [address] [place] [address] [phone]           Signed: Temple PaciniOOL,Sabreena Vogan A 09/10/2016, 9:24 AM

## 2016-10-31 DIAGNOSIS — R87810 Cervical high risk human papillomavirus (HPV) DNA test positive: Secondary | ICD-10-CM | POA: Insufficient documentation

## 2016-10-31 DIAGNOSIS — R87612 Low grade squamous intraepithelial lesion on cytologic smear of cervix (LGSIL): Secondary | ICD-10-CM | POA: Insufficient documentation

## 2017-11-24 ENCOUNTER — Encounter: Payer: Self-pay | Admitting: Gynecologic Oncology

## 2018-06-07 IMAGING — RF DG C-ARM 61-120 MIN
1 series · 2 of 2 positions shown · non-contrast
Comparison: 08/06/2016

CLINICAL DATA: Lumbar fusion

EXAM:
DG C-ARM 61-120 MIN; LUMBAR SPINE - 2-3 VIEW

[Series 1: run · 2 of 2 slices shown]
[im 1/2]
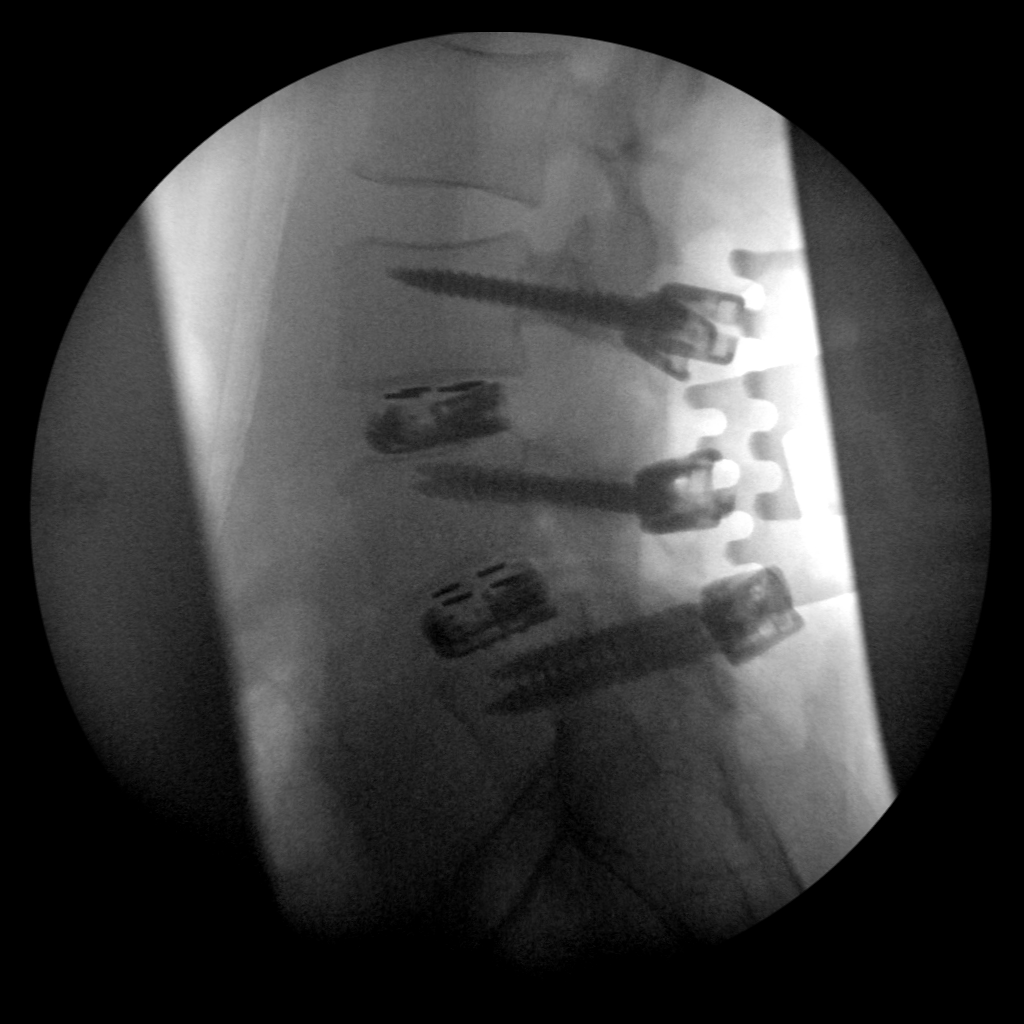
[im 2/2]
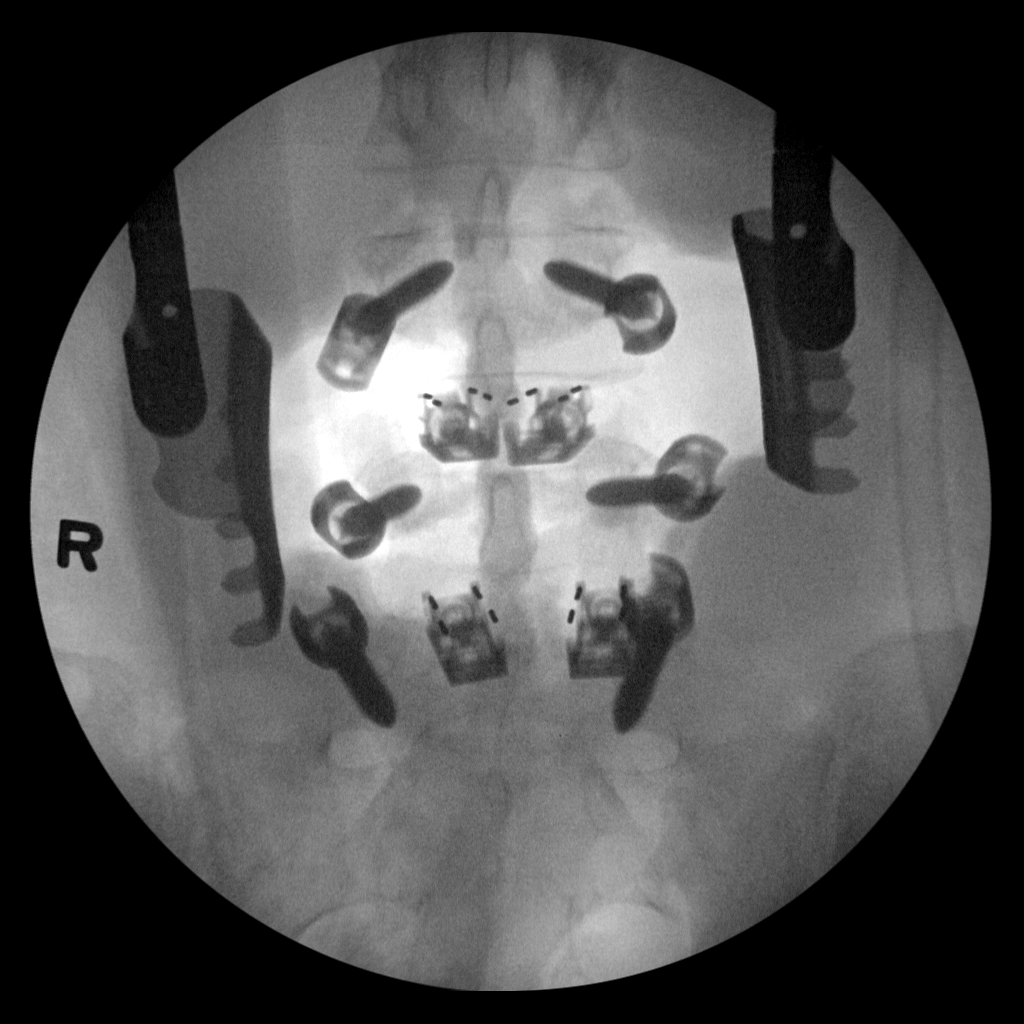

[2 of 2 positions shown; findings below may reference images not displayed]

FLUOROSCOPY TIME:  Fluoroscopy Time:  1 minutes 2 seconds

Radiation Exposure Index (if provided by the fluoroscopic device):
Not available

Number of Acquired Spot Images: 2
FINDINGS: Pedicle screws are noted at L4, L5 and S1. Mild anterolisthesis at
L5-S1 is again seen. Interbody fusion is noted at L4-5 and L5-S1.
The numbering nomenclature is similar to that utilized on recent
MRI.
IMPRESSION: Lumbar fusion as described.

## 2019-01-19 DIAGNOSIS — N898 Other specified noninflammatory disorders of vagina: Secondary | ICD-10-CM | POA: Insufficient documentation

## 2019-02-25 ENCOUNTER — Other Ambulatory Visit: Payer: Self-pay

## 2019-02-25 ENCOUNTER — Ambulatory Visit (INDEPENDENT_AMBULATORY_CARE_PROVIDER_SITE_OTHER): Payer: Medicare Other

## 2019-02-25 ENCOUNTER — Other Ambulatory Visit: Payer: Self-pay | Admitting: Podiatry

## 2019-02-25 ENCOUNTER — Ambulatory Visit (INDEPENDENT_AMBULATORY_CARE_PROVIDER_SITE_OTHER): Payer: Medicare Other | Admitting: Podiatry

## 2019-02-25 DIAGNOSIS — M25571 Pain in right ankle and joints of right foot: Secondary | ICD-10-CM

## 2019-02-25 DIAGNOSIS — M7751 Other enthesopathy of right foot: Secondary | ICD-10-CM

## 2019-02-25 DIAGNOSIS — M778 Other enthesopathies, not elsewhere classified: Secondary | ICD-10-CM

## 2019-02-25 DIAGNOSIS — M79671 Pain in right foot: Secondary | ICD-10-CM

## 2019-03-27 NOTE — Progress Notes (Signed)
Subjective:  Patient ID: Linda Meyers, female    DOB: March 08, 1976,  MRN: [id number]  Chief Complaint  Patient presents with  . Foot Injury    Right ankle injury, pt states weight lifting bar fell onto ankle 1 month ago. Pt states dull ache occasionally.     43 y.o. female presents with the above complaint. Hx as above.   Review of Systems: Negative except as noted in the HPI. Denies N/V/F/Ch.  Past Medical History:  Diagnosis Date  . Anemia   . Anxiety   . Arthritis   . Depression   . Lupus (systemic lupus erythematosus) (HCC) 1999    Current Outpatient Medications:  .  amitriptyline (ELAVIL) 25 MG tablet, Take by mouth., Disp: , Rfl:  .  clindamycin (CLEOCIN T) 1 % external solution, Apply a thin layer to the groin every morning. Do not rinse off., Disp: , Rfl:  .  clindamycin-benzoyl peroxide (BENZACLIN) gel, Apply daily to affected area, Disp: , Rfl:  .  colchicine 0.6 MG tablet, Take by mouth., Disp: , Rfl:  .  ibuprofen (ADVIL) 800 MG tablet, Take by mouth., Disp: , Rfl:  .  lidocaine (XYLOCAINE) 2 % jelly, Apply 3 times daily as needed, Disp: , Rfl:  .  metroNIDAZOLE (VANDAZOLE) 0.75 % vaginal gel, , Disp: , Rfl:  .  cholecalciferol (VITAMIN D) 1000 units tablet, Take 1,000 Units by mouth every evening., Disp: , Rfl:  .  clonazePAM (KLONOPIN) 1 MG tablet, Take 1 tablet by mouth at bedtime as needed. For heightened anxiety at night to induce sleep., Disp: , Rfl:  .  Coenzyme Q10 (COQ10) 100 MG CAPS, Take 100 mg by mouth every evening., Disp: , Rfl:  .  cyclobenzaprine (FLEXERIL) 10 MG tablet, , Disp: , Rfl:  .  diazepam (VALIUM) 5 MG tablet, Take 1 tablet (5 mg total) by mouth every 6 (six) hours as needed for muscle spasms., Disp: 40 tablet, Rfl: 0 .  diphenhydramine-acetaminophen (TYLENOL PM) 25-500 MG TABS tablet, Take 1 tablet by mouth at bedtime as needed (for pain)., Disp: , Rfl:  .  doxycycline (VIBRA-TABS) 100 MG tablet, TAKE 1 TABLET BY MOUTH TWICE A DAY FOR 7  DAYS, Disp: , Rfl:  .  Flaxseed, Linseed, (FLAXSEED OIL) 1000 MG CAPS, Take 1,000 mg by mouth every evening., Disp: , Rfl:  .  folic acid (FOLVITE) 400 MCG tablet, Take 400 mcg by mouth every evening., Disp: , Rfl:  .  HYDROmorphone (DILAUDID) 2 MG tablet, Take 1 tablet (2 mg total) by mouth every 4 (four) hours as needed for severe pain., Disp: 60 tablet, Rfl: 0 .  imiquimod (ALDARA) 5 % cream, Apply 1 application topically every other day., Disp: , Rfl:  .  Multiple Vitamin (MULTIVITAMIN) capsule, Take by mouth., Disp: , Rfl:  .  Multiple Vitamins-Minerals (ANTIOXIDANT VITAMIN/MINERAL) CAPS, Take 1 capsule by mouth every evening., Disp: , Rfl:  .  naproxen sodium (ANAPROX) 220 MG tablet, Take 440 mg by mouth at bedtime as needed (for pain)., Disp: , Rfl:  .  Nutritional Supplements (GRAPESEED EXTRACT) 500-50 MG CAPS, Take 500 mg by mouth every evening., Disp: , Rfl:  .  omega-3 acid ethyl esters (LOVAZA) 1 g capsule, Take by mouth., Disp: , Rfl:  .  Omega-3 Fatty Acids (FISH OIL) 1000 MG CAPS, Take 1,000 mg by mouth every evening., Disp: , Rfl:  .  polyvinyl alcohol (LUBRICANT DROPS) 1.4 % ophthalmic solution, Place 1-2 drops into both eyes 3 (three) times daily as  needed for dry eyes., Disp: , Rfl:  .  traMADol (ULTRAM) 50 MG tablet, , Disp: , Rfl:  .  VALTREX 1 g tablet, Take 1 g by mouth daily as needed. For outbreaks., Disp: , Rfl:  .  vitamin A 8000 UNIT capsule, Take 8,000 Units by mouth every evening., Disp: , Rfl:  .  vitamin C (ASCORBIC ACID) 500 MG tablet, Take 500 mg by mouth every evening., Disp: , Rfl:   Social History   Tobacco Use  Smoking Status Never Smoker  Smokeless Tobacco Never Used    Allergies  Allergen Reactions  . Lortab [Hydrocodone-Acetaminophen] Itching and Nausea And Vomiting    "NO OPOID"  . Percocet [Oxycodone-Acetaminophen] Nausea And Vomiting    "NO OPOID"  . Propoxyphene Nausea And Vomiting   Objective:  There were no vitals filed for this visit.  There is no height or weight on file to calculate BMI. Constitutional Well developed. Well nourished.  Vascular Dorsalis pedis pulses palpable bilaterally. Posterior tibial pulses palpable bilaterally. Capillary refill normal to all digits.  No cyanosis or clubbing noted. Pedal hair growth normal.  Neurologic Normal speech. Oriented to person, place, and time. Epicritic sensation to light touch grossly present bilaterally.  Dermatologic Nails well groomed and normal in appearance. No open wounds. No skin lesions.  Orthopedic:  Pain elevation about the right ankle lateral ankle gutter negative anterior drawer negative talar tilt   Radiographs: Taken and reviewed no acute fractures dislocations Assessment:   1. Capsulitis of right ankle   2. Acute right ankle pain    Plan:  Patient was evaluated and treated and all questions answered.  Right ankle capsulitis -Injection as below -Rest, ice the area  Procedure: Joint Injection Location: Right ankle joint Skin Prep: Alcohol. Injectate: 0.5 cc 1% lidocaine plain, 1 cc dexamethasone phosphate, 0.5 cc Kenalog 10 Disposition: Patient tolerated procedure well. Injection site dressed with a band-aid.    Return in about 3 weeks (around 03/18/2019) for Capsulitis, Right ankle .

## 2019-04-01 ENCOUNTER — Other Ambulatory Visit: Payer: Self-pay

## 2019-04-01 DIAGNOSIS — Z20822 Contact with and (suspected) exposure to covid-19: Secondary | ICD-10-CM

## 2019-04-02 LAB — NOVEL CORONAVIRUS, NAA: SARS-CoV-2, NAA: DETECTED — AB

## 2019-04-13 ENCOUNTER — Other Ambulatory Visit: Payer: Self-pay

## 2019-04-13 DIAGNOSIS — Z20822 Contact with and (suspected) exposure to covid-19: Secondary | ICD-10-CM

## 2019-04-14 LAB — NOVEL CORONAVIRUS, NAA: SARS-CoV-2, NAA: NOT DETECTED

## 2019-06-01 ENCOUNTER — Ambulatory Visit (INDEPENDENT_AMBULATORY_CARE_PROVIDER_SITE_OTHER): Payer: Medicare Other | Admitting: Podiatry

## 2019-06-01 ENCOUNTER — Other Ambulatory Visit: Payer: Self-pay

## 2019-06-01 DIAGNOSIS — B351 Tinea unguium: Secondary | ICD-10-CM

## 2019-06-02 ENCOUNTER — Ambulatory Visit: Payer: Medicare Other | Admitting: Podiatry

## 2019-06-02 LAB — HEPATIC FUNCTION PANEL
AG Ratio: 1.1 (calc) (ref 1.0–2.5)
ALT: 12 U/L (ref 6–29)
AST: 12 U/L (ref 10–35)
Albumin: 3.9 g/dL (ref 3.6–5.1)
Alkaline phosphatase (APISO): 109 U/L (ref 31–125)
Bilirubin, Direct: 0.1 mg/dL (ref 0.0–0.2)
Globulin: 3.5 g/dL (calc) (ref 1.9–3.7)
Indirect Bilirubin: 0.1 mg/dL (calc) — ABNORMAL LOW (ref 0.2–1.2)
Total Bilirubin: 0.2 mg/dL (ref 0.2–1.2)
Total Protein: 7.4 g/dL (ref 6.1–8.1)

## 2019-06-03 ENCOUNTER — Encounter: Payer: Self-pay | Admitting: Podiatry

## 2019-06-03 ENCOUNTER — Other Ambulatory Visit: Payer: Self-pay

## 2019-06-03 MED ORDER — TERBINAFINE HCL 250 MG PO TABS: 250.0000 mg | ORAL_TABLET | Freq: Every day | ORAL | 0 refills | Status: DC

## 2019-06-03 NOTE — Progress Notes (Signed)
  Subjective:  Patient ID: Linda Meyers, female    DOB: Mar 06, 1976,  MRN: [id number]  Chief Complaint  Patient presents with  . Foot Pain    pt has a possible toenail growth/ fungus under both of the big toenails, pt states that it has been going on for about 5 years.   43 y.o. female returns for the above complaint.  Patient presents with complaints of onychomycosis to both of her hallux as well as both of her fifth digit.  She states is been going on for about 5 years nothing has helped alleviate the pain as well as the discoloration of the onychomycosis.  Patient has a history of Raynaud's and lupus and is possible that that could be the case.  Patient also states that it is getting better when she is in the shower and it makes it softer.  She denies any other acute complaints.  She would like to know if there is anything that could be treated for this toenail fungus.  Objective:  There were no vitals filed for this visit. Podiatric Exam: Vascular: dorsalis pedis and posterior tibial pulses are palpable bilateral. Capillary return is immediate. Temperature gradient is WNL. Skin turgor WNL  Sensorium: Normal Semmes Weinstein monofilament test. Normal tactile sensation bilaterally. Nail Exam: Patient has thick disfigured discolored nails with subungual debris of bilateral hallux as well as fifth digit nail. Ulcer Exam: There is no evidence of ulcer or pre-ulcerative changes or infection. Orthopedic Exam: Muscle tone and strength are WNL. No limitations in general ROM. No crepitus or effusions noted. HAV  B/L.    No hammer toes 2-5  B/L. Skin: No Porokeratosis. No infection or ulcers  Assessment & Plan:  Patient was evaluated and treated and all questions answered.  -Onychomycosis of bilateral hallux/bilateral fifth digit -I explained to the patient the etiology and various treatment options available for onychomycosis including topical p.o. and laser therapy.  Patient elected to undergo p.o.  therapy at this time.  I explained to her that in order to start the p.o. therapy I would need to obtain a liver function test followed by starting Lamisil therapy.  Patient will obtain the liver function test and I will send the prescription over for Lamisil therapy if normal. -I will see her back again in 3-1/2 months after she has completed 25-month course of Lamisil therapy.  I will evaluate the nail and see how is doing.  Boneta Lucks, DPM    No follow-ups on file.

## 2019-06-06 ENCOUNTER — Telehealth: Payer: Self-pay | Admitting: *Deleted

## 2019-06-06 NOTE — Telephone Encounter (Signed)
Pt states she she had the liver function test performed.

## 2019-06-07 NOTE — Telephone Encounter (Signed)
Left message that Dr. Posey Pronto had reviewed the labs and rx had been sent to the [facility].

## 2019-06-07 NOTE — Telephone Encounter (Signed)
Hi Valery,  I have already sent this to her pharmacy. Can you confirm that she got it. It was pharmacy that is listed on the chart.  Thanks  Lennette Bihari

## 2019-07-13 ENCOUNTER — Telehealth: Payer: Self-pay | Admitting: *Deleted

## 2019-07-14 ENCOUNTER — Telehealth: Payer: Self-pay | Admitting: Podiatry

## 2019-07-14 NOTE — Telephone Encounter (Signed)
Called lvm for pt to call and schedule appt if she wanted to change topical ointments.

## 2019-07-22 ENCOUNTER — Other Ambulatory Visit: Payer: Self-pay

## 2019-07-22 ENCOUNTER — Ambulatory Visit (INDEPENDENT_AMBULATORY_CARE_PROVIDER_SITE_OTHER): Payer: Medicare Other | Admitting: Podiatry

## 2019-07-22 VITALS — Temp 96.4°F

## 2019-07-22 DIAGNOSIS — B351 Tinea unguium: Secondary | ICD-10-CM

## 2019-07-22 DIAGNOSIS — Z79899 Other long term (current) drug therapy: Secondary | ICD-10-CM | POA: Diagnosis not present

## 2019-07-22 NOTE — Progress Notes (Signed)
  Subjective:  Patient ID: Linda Meyers, female    DOB: Nov 04, 1975,  MRN: [id number]  Chief Complaint  Patient presents with  . Allergic Reaction    ? Reaction to Lamisil. Pt stated, "Feet started itching after using Lamisil. Small white bumps appeared on my hands. Itching and dryness. Stoppped Lamisil for 1 week. I was also using Metronidazole gel (Rx'd by GYN) at the time. Bath and Body hyaluronic lotion helps the itching".    44 y.o. female presents with the above complaint. History confirmed with patient.  Objective:  Physical Exam: warm, good capillary refill, no trophic changes or ulcerative lesions, normal DP and PT pulses and normal sensory exam. Nails with transverse ridging, some proximal clearing Left Foot: normal exam, no swelling, tenderness, instability; ligaments intact, full range of motion of all ankle/foot joints  Right Foot: normal exam, no swelling, tenderness, instability; ligaments intact, full range of motion of all ankle/foot joints   Assessment:   1. Toenail fungus   2. Encounter for long-term (current) use of high-risk medication     Plan:  Patient was evaluated and treated and all questions answered.  Onychomycosis -Resume lamisil. D/c if side effects recur. Effects probably due to concurrent flagyl.  -F/u with Dr. Allena Katz as scheduled.  No follow-ups on file.   MDM

## 2019-07-22 NOTE — Telephone Encounter (Signed)
Error.Linda Meyers 

## 2019-08-19 ENCOUNTER — Other Ambulatory Visit: Payer: Self-pay | Admitting: *Deleted

## 2019-08-19 MED ORDER — CICLOPIROX 8 % EX SOLN: Freq: Every day | CUTANEOUS | 0 refills | Status: DC

## 2019-08-19 NOTE — Telephone Encounter (Signed)
Per Dr Allena Katz ok to fill the penlac topical solution and I called the patient to let her know and left a message. Misty Stanley

## 2019-08-19 NOTE — Addendum Note (Signed)
Addended by: Nicholes Rough on: 08/19/2019 04:34 PM   Modules accepted: Orders

## 2019-09-30 ENCOUNTER — Ambulatory Visit: Payer: Medicare Other | Attending: Internal Medicine

## 2019-09-30 DIAGNOSIS — Z23 Encounter for immunization: Secondary | ICD-10-CM

## 2019-09-30 NOTE — Progress Notes (Signed)
   Covid-19 Vaccination Clinic  Name:  Linda Meyers    MRN: [id number] DOB: [birth year]  09/30/2019  Linda Meyers was observed post Covid-19 immunization for 15 minutes without incident. She was provided with Vaccine Information Sheet and instruction to access the V-Safe system.   Linda Meyers was instructed to call 911 with any severe reactions post vaccine: Marland Kitchen Difficulty breathing  . Swelling of face and throat  . A fast heartbeat  . A bad rash all over body  . Dizziness and weakness   Immunizations Administered    Name Date Dose VIS Date Route   Pfizer COVID-19 Vaccine 09/30/2019  2:43 PM 0.3 mL 06/10/2019 Intramuscular   Manufacturer: [employer], [employer]   Lot: [id number]   NDC: [address]-0

## 2019-10-24 ENCOUNTER — Ambulatory Visit: Payer: Medicare Other | Attending: Internal Medicine

## 2019-10-24 DIAGNOSIS — Z23 Encounter for immunization: Secondary | ICD-10-CM

## 2019-10-24 NOTE — Progress Notes (Signed)
   Covid-19 Vaccination Clinic  Name:  Gene Colee    MRN: [id number] DOB: 11-Oct-1975  10/24/2019  Ms. Wellman was observed post Covid-19 immunization for 15 minutes without incident. She was provided with Vaccine Information Sheet and instruction to access the V-Safe system.   Ms. Salmons was instructed to call 911 with any severe reactions post vaccine: Marland Kitchen Difficulty breathing  . Swelling of face and throat  . A fast heartbeat  . A bad rash all over body  . Dizziness and weakness   Immunizations Administered    Name Date Dose VIS Date Route   Pfizer COVID-19 Vaccine 10/24/2019  3:12 PM 0.3 mL 08/24/2018 Intramuscular   Manufacturer: [employer], [employer]   Lot: [id number]   NDC: [address]-5

## 2020-07-02 DIAGNOSIS — Z20822 Contact with and (suspected) exposure to covid-19: Secondary | ICD-10-CM | POA: Diagnosis not present

## 2020-07-03 DIAGNOSIS — I8311 Varicose veins of right lower extremity with inflammation: Secondary | ICD-10-CM | POA: Diagnosis not present

## 2020-07-14 DIAGNOSIS — Z1231 Encounter for screening mammogram for malignant neoplasm of breast: Secondary | ICD-10-CM | POA: Diagnosis not present

## 2020-07-18 DIAGNOSIS — Z483 Aftercare following surgery for neoplasm: Secondary | ICD-10-CM | POA: Diagnosis not present

## 2020-08-08 DIAGNOSIS — Z9889 Other specified postprocedural states: Secondary | ICD-10-CM | POA: Diagnosis not present

## 2020-08-08 DIAGNOSIS — K6282 Dysplasia of anus: Secondary | ICD-10-CM | POA: Diagnosis not present

## 2020-08-08 DIAGNOSIS — R85613 High grade squamous intraepithelial lesion on cytologic smear of anus (HGSIL): Secondary | ICD-10-CM | POA: Diagnosis not present

## 2020-09-13 DIAGNOSIS — K6282 Dysplasia of anus: Secondary | ICD-10-CM | POA: Diagnosis not present

## 2020-09-13 DIAGNOSIS — Z87891 Personal history of nicotine dependence: Secondary | ICD-10-CM | POA: Diagnosis not present

## 2020-09-21 DIAGNOSIS — Z01818 Encounter for other preprocedural examination: Secondary | ICD-10-CM | POA: Diagnosis not present

## 2020-09-21 DIAGNOSIS — Z20822 Contact with and (suspected) exposure to covid-19: Secondary | ICD-10-CM | POA: Diagnosis not present

## 2020-09-26 DIAGNOSIS — K6282 Dysplasia of anus: Secondary | ICD-10-CM | POA: Diagnosis not present

## 2020-09-26 DIAGNOSIS — R85613 High grade squamous intraepithelial lesion on cytologic smear of anus (HGSIL): Secondary | ICD-10-CM | POA: Diagnosis not present

## 2020-09-26 DIAGNOSIS — K644 Residual hemorrhoidal skin tags: Secondary | ICD-10-CM | POA: Diagnosis not present

## 2020-09-26 DIAGNOSIS — K620 Anal polyp: Secondary | ICD-10-CM | POA: Diagnosis not present

## 2020-11-27 ENCOUNTER — Other Ambulatory Visit: Payer: Self-pay

## 2020-11-27 ENCOUNTER — Ambulatory Visit (INDEPENDENT_AMBULATORY_CARE_PROVIDER_SITE_OTHER): Payer: Medicare Other | Admitting: Podiatry

## 2020-11-27 DIAGNOSIS — B351 Tinea unguium: Secondary | ICD-10-CM

## 2020-11-27 DIAGNOSIS — L603 Nail dystrophy: Secondary | ICD-10-CM

## 2020-11-27 MED ORDER — FLUCONAZOLE 150 MG PO TABS: 150.0000 mg | ORAL_TABLET | ORAL | 2 refills | Status: DC

## 2020-11-27 MED ORDER — CICLOPIROX 8 % EX SOLN: Freq: Every day | CUTANEOUS | 0 refills | Status: DC

## 2020-11-27 NOTE — Progress Notes (Signed)
  Subjective:  Patient ID: Linda Meyers, female    DOB: Oct 27, 1975,  MRN: [id number]  No chief complaint on file.  45 y.o. female presents for f/u of nail fungus. Completed lamisil but nails are still thickened and discolored. Would like to discuss other options.  Objective:  Physical Exam: warm, good capillary refill, no trophic changes or ulcerative lesions, normal DP and PT pulses and normal sensory exam. Nails with distal dystrophy, thickening, lysis Left Foot: normal exam, no swelling, tenderness, instability; ligaments intact, full range of motion of all ankle/foot joints  Right Foot: normal exam, no swelling, tenderness, instability; ligaments intact, full range of motion of all ankle/foot joints   Assessment:   1. Onychomycosis   2. Nail dystrophy     Plan:  Patient was evaluated and treated and all questions answered.  Onychomycosis -Trial fluconazole and penlac given failure with lamisil. -Possible traumatic component   Return in about 6 weeks (around 01/08/2021) for Nail Fungus.

## 2020-12-12 DIAGNOSIS — M329 Systemic lupus erythematosus, unspecified: Secondary | ICD-10-CM | POA: Diagnosis not present

## 2020-12-12 DIAGNOSIS — Z1159 Encounter for screening for other viral diseases: Secondary | ICD-10-CM | POA: Diagnosis not present

## 2020-12-12 DIAGNOSIS — I7300 Raynaud's syndrome without gangrene: Secondary | ICD-10-CM | POA: Diagnosis not present

## 2020-12-12 DIAGNOSIS — K6282 Dysplasia of anus: Secondary | ICD-10-CM | POA: Diagnosis not present

## 2020-12-28 DIAGNOSIS — M329 Systemic lupus erythematosus, unspecified: Secondary | ICD-10-CM | POA: Diagnosis not present

## 2020-12-28 DIAGNOSIS — L729 Follicular cyst of the skin and subcutaneous tissue, unspecified: Secondary | ICD-10-CM | POA: Diagnosis not present

## 2020-12-28 DIAGNOSIS — L739 Follicular disorder, unspecified: Secondary | ICD-10-CM | POA: Diagnosis not present

## 2020-12-28 DIAGNOSIS — L249 Irritant contact dermatitis, unspecified cause: Secondary | ICD-10-CM | POA: Diagnosis not present

## 2020-12-28 DIAGNOSIS — Z79899 Other long term (current) drug therapy: Secondary | ICD-10-CM | POA: Diagnosis not present

## 2020-12-28 DIAGNOSIS — L723 Sebaceous cyst: Secondary | ICD-10-CM | POA: Diagnosis not present

## 2021-01-08 ENCOUNTER — Ambulatory Visit (INDEPENDENT_AMBULATORY_CARE_PROVIDER_SITE_OTHER): Payer: Medicare Other | Admitting: Podiatry

## 2021-01-08 DIAGNOSIS — Z5329 Procedure and treatment not carried out because of patient's decision for other reasons: Secondary | ICD-10-CM

## 2021-01-18 ENCOUNTER — Ambulatory Visit (INDEPENDENT_AMBULATORY_CARE_PROVIDER_SITE_OTHER): Payer: Medicare Other | Admitting: Podiatry

## 2021-01-18 ENCOUNTER — Other Ambulatory Visit: Payer: Self-pay

## 2021-01-18 DIAGNOSIS — B351 Tinea unguium: Secondary | ICD-10-CM

## 2021-01-18 DIAGNOSIS — L603 Nail dystrophy: Secondary | ICD-10-CM | POA: Diagnosis not present

## 2021-01-22 DIAGNOSIS — K6282 Dysplasia of anus: Secondary | ICD-10-CM | POA: Diagnosis not present

## 2021-01-25 NOTE — Progress Notes (Signed)
  Subjective:  Patient ID: Linda Meyers, female    DOB: [birth year],  MRN: [id number]  Chief Complaint  Patient presents with   Nail Problem    Nail Fungus no more est pt slots,missed her previous fu   45 y.o. female presents for f/u of nail fungus.  Thinks that the nails are doing better Objective:  Physical Exam: warm, good capillary refill, no trophic changes or ulcerative lesions, normal DP and PT pulses and normal sensory exam. Nails with mild dystrophic changes but proximal clearing Left Foot: normal exam, no swelling, tenderness, instability; ligaments intact, full range of motion of all ankle/foot joints  Right Foot: normal exam, no swelling, tenderness, instability; ligaments intact, full range of motion of all ankle/foot joints   Assessment:   1. Onychomycosis   2. Nail dystrophy      Plan:  Patient was evaluated and treated and all questions answered.  Onychomycosis -Nails appear to be improved.  At this point we can discontinue further treatment and await further nail growth.  Follow-up should issues persist  No follow-ups on file.

## 2021-02-22 DIAGNOSIS — K6282 Dysplasia of anus: Secondary | ICD-10-CM | POA: Diagnosis not present

## 2021-02-22 DIAGNOSIS — D013 Carcinoma in situ of anus and anal canal: Secondary | ICD-10-CM | POA: Diagnosis not present

## 2021-02-22 DIAGNOSIS — R85613 High grade squamous intraepithelial lesion on cytologic smear of anus (HGSIL): Secondary | ICD-10-CM | POA: Diagnosis not present

## 2021-03-08 DIAGNOSIS — Z01818 Encounter for other preprocedural examination: Secondary | ICD-10-CM | POA: Diagnosis not present

## 2021-03-22 DIAGNOSIS — I83891 Varicose veins of right lower extremities with other complications: Secondary | ICD-10-CM | POA: Diagnosis not present

## 2021-03-22 DIAGNOSIS — Z780 Asymptomatic menopausal state: Secondary | ICD-10-CM | POA: Diagnosis not present

## 2021-03-22 DIAGNOSIS — B9689 Other specified bacterial agents as the cause of diseases classified elsewhere: Secondary | ICD-10-CM | POA: Diagnosis not present

## 2021-03-22 DIAGNOSIS — R600 Localized edema: Secondary | ICD-10-CM | POA: Diagnosis not present

## 2021-03-27 DIAGNOSIS — K6282 Dysplasia of anus: Secondary | ICD-10-CM | POA: Diagnosis not present

## 2021-03-27 DIAGNOSIS — Z48815 Encounter for surgical aftercare following surgery on the digestive system: Secondary | ICD-10-CM | POA: Diagnosis not present

## 2021-04-09 DIAGNOSIS — I8311 Varicose veins of right lower extremity with inflammation: Secondary | ICD-10-CM | POA: Diagnosis not present

## 2021-04-18 DIAGNOSIS — K6282 Dysplasia of anus: Secondary | ICD-10-CM | POA: Diagnosis not present

## 2021-04-18 DIAGNOSIS — R85613 High grade squamous intraepithelial lesion on cytologic smear of anus (HGSIL): Secondary | ICD-10-CM | POA: Diagnosis not present

## 2021-04-30 DIAGNOSIS — Z9889 Other specified postprocedural states: Secondary | ICD-10-CM | POA: Diagnosis not present

## 2021-05-03 DIAGNOSIS — K6289 Other specified diseases of anus and rectum: Secondary | ICD-10-CM | POA: Diagnosis not present

## 2021-05-03 DIAGNOSIS — K6282 Dysplasia of anus: Secondary | ICD-10-CM | POA: Diagnosis not present

## 2021-05-08 DIAGNOSIS — J3489 Other specified disorders of nose and nasal sinuses: Secondary | ICD-10-CM | POA: Diagnosis not present

## 2021-06-14 DIAGNOSIS — G8918 Other acute postprocedural pain: Secondary | ICD-10-CM | POA: Diagnosis not present

## 2021-06-14 DIAGNOSIS — K6282 Dysplasia of anus: Secondary | ICD-10-CM | POA: Diagnosis not present

## 2021-06-14 DIAGNOSIS — R85613 High grade squamous intraepithelial lesion on cytologic smear of anus (HGSIL): Secondary | ICD-10-CM | POA: Diagnosis not present

## 2021-07-12 DIAGNOSIS — K6282 Dysplasia of anus: Secondary | ICD-10-CM | POA: Diagnosis not present

## 2021-07-12 DIAGNOSIS — R85613 High grade squamous intraepithelial lesion on cytologic smear of anus (HGSIL): Secondary | ICD-10-CM | POA: Diagnosis not present

## 2021-08-07 DIAGNOSIS — L700 Acne vulgaris: Secondary | ICD-10-CM | POA: Diagnosis not present

## 2021-08-20 DIAGNOSIS — R079 Chest pain, unspecified: Secondary | ICD-10-CM | POA: Diagnosis not present

## 2021-08-28 DIAGNOSIS — E7800 Pure hypercholesterolemia, unspecified: Secondary | ICD-10-CM | POA: Diagnosis not present

## 2021-08-28 DIAGNOSIS — R5383 Other fatigue: Secondary | ICD-10-CM | POA: Diagnosis not present

## 2021-08-30 DIAGNOSIS — K6282 Dysplasia of anus: Secondary | ICD-10-CM | POA: Diagnosis not present

## 2021-08-30 DIAGNOSIS — R85613 High grade squamous intraepithelial lesion on cytologic smear of anus (HGSIL): Secondary | ICD-10-CM | POA: Diagnosis not present

## 2021-08-30 DIAGNOSIS — R85612 Low grade squamous intraepithelial lesion on cytologic smear of anus (LGSIL): Secondary | ICD-10-CM | POA: Diagnosis not present

## 2021-09-04 DIAGNOSIS — R454 Irritability and anger: Secondary | ICD-10-CM | POA: Diagnosis not present

## 2021-09-04 DIAGNOSIS — M329 Systemic lupus erythematosus, unspecified: Secondary | ICD-10-CM | POA: Diagnosis not present

## 2021-09-04 DIAGNOSIS — R232 Flushing: Secondary | ICD-10-CM | POA: Diagnosis not present

## 2021-09-04 DIAGNOSIS — G479 Sleep disorder, unspecified: Secondary | ICD-10-CM | POA: Diagnosis not present

## 2021-09-04 DIAGNOSIS — E7800 Pure hypercholesterolemia, unspecified: Secondary | ICD-10-CM | POA: Diagnosis not present

## 2021-09-18 DIAGNOSIS — E7800 Pure hypercholesterolemia, unspecified: Secondary | ICD-10-CM | POA: Diagnosis not present

## 2021-09-24 DIAGNOSIS — E7800 Pure hypercholesterolemia, unspecified: Secondary | ICD-10-CM | POA: Diagnosis not present

## 2021-10-17 DIAGNOSIS — E7800 Pure hypercholesterolemia, unspecified: Secondary | ICD-10-CM | POA: Diagnosis not present

## 2021-10-24 DIAGNOSIS — M329 Systemic lupus erythematosus, unspecified: Secondary | ICD-10-CM | POA: Diagnosis not present

## 2021-10-25 DIAGNOSIS — Z136 Encounter for screening for cardiovascular disorders: Secondary | ICD-10-CM | POA: Diagnosis not present

## 2021-10-25 DIAGNOSIS — Z0000 Encounter for general adult medical examination without abnormal findings: Secondary | ICD-10-CM | POA: Diagnosis not present

## 2021-10-25 DIAGNOSIS — R5383 Other fatigue: Secondary | ICD-10-CM | POA: Diagnosis not present

## 2021-10-25 DIAGNOSIS — M329 Systemic lupus erythematosus, unspecified: Secondary | ICD-10-CM | POA: Diagnosis not present

## 2021-10-25 DIAGNOSIS — Z13220 Encounter for screening for lipoid disorders: Secondary | ICD-10-CM | POA: Diagnosis not present

## 2021-10-25 DIAGNOSIS — M25569 Pain in unspecified knee: Secondary | ICD-10-CM | POA: Diagnosis not present

## 2021-11-01 DIAGNOSIS — E7800 Pure hypercholesterolemia, unspecified: Secondary | ICD-10-CM | POA: Diagnosis not present

## 2021-11-04 DIAGNOSIS — Z1231 Encounter for screening mammogram for malignant neoplasm of breast: Secondary | ICD-10-CM | POA: Diagnosis not present

## 2021-11-07 DIAGNOSIS — E7800 Pure hypercholesterolemia, unspecified: Secondary | ICD-10-CM | POA: Diagnosis not present

## 2021-12-13 ENCOUNTER — Other Ambulatory Visit: Payer: Self-pay | Admitting: Internal Medicine

## 2021-12-17 ENCOUNTER — Other Ambulatory Visit: Payer: Self-pay | Admitting: Internal Medicine

## 2021-12-17 ENCOUNTER — Ambulatory Visit
Admission: RE | Admit: 2021-12-17 | Discharge: 2021-12-17 | Disposition: A | Discharge status: Home / Self Care | Payer: Medicare Other | Source: Ambulatory Visit | Attending: Internal Medicine | Admitting: Internal Medicine

## 2021-12-17 DIAGNOSIS — M25561 Pain in right knee: Secondary | ICD-10-CM | POA: Diagnosis not present

## 2021-12-27 DIAGNOSIS — K6282 Dysplasia of anus: Secondary | ICD-10-CM | POA: Diagnosis not present

## 2022-01-08 DIAGNOSIS — M329 Systemic lupus erythematosus, unspecified: Secondary | ICD-10-CM | POA: Diagnosis not present

## 2022-01-08 DIAGNOSIS — Z79899 Other long term (current) drug therapy: Secondary | ICD-10-CM | POA: Diagnosis not present

## 2022-02-10 DIAGNOSIS — L700 Acne vulgaris: Secondary | ICD-10-CM | POA: Diagnosis not present

## 2022-02-10 DIAGNOSIS — L658 Other specified nonscarring hair loss: Secondary | ICD-10-CM | POA: Diagnosis not present

## 2022-02-21 DIAGNOSIS — Z79899 Other long term (current) drug therapy: Secondary | ICD-10-CM | POA: Diagnosis not present

## 2022-02-21 DIAGNOSIS — Z0000 Encounter for general adult medical examination without abnormal findings: Secondary | ICD-10-CM | POA: Diagnosis not present

## 2022-02-21 DIAGNOSIS — M48061 Spinal stenosis, lumbar region without neurogenic claudication: Secondary | ICD-10-CM | POA: Diagnosis not present

## 2022-02-21 DIAGNOSIS — Z1159 Encounter for screening for other viral diseases: Secondary | ICD-10-CM | POA: Diagnosis not present

## 2022-02-21 DIAGNOSIS — M329 Systemic lupus erythematosus, unspecified: Secondary | ICD-10-CM | POA: Diagnosis not present

## 2022-02-21 DIAGNOSIS — L700 Acne vulgaris: Secondary | ICD-10-CM | POA: Diagnosis not present

## 2022-02-21 DIAGNOSIS — M1711 Unilateral primary osteoarthritis, right knee: Secondary | ICD-10-CM | POA: Diagnosis not present

## 2022-02-21 DIAGNOSIS — I7300 Raynaud's syndrome without gangrene: Secondary | ICD-10-CM | POA: Diagnosis not present

## 2022-02-27 DIAGNOSIS — M1711 Unilateral primary osteoarthritis, right knee: Secondary | ICD-10-CM | POA: Diagnosis not present

## 2022-02-27 DIAGNOSIS — Z0001 Encounter for general adult medical examination with abnormal findings: Secondary | ICD-10-CM | POA: Diagnosis not present

## 2022-02-27 DIAGNOSIS — M329 Systemic lupus erythematosus, unspecified: Secondary | ICD-10-CM | POA: Diagnosis not present

## 2022-02-27 DIAGNOSIS — I7300 Raynaud's syndrome without gangrene: Secondary | ICD-10-CM | POA: Diagnosis not present

## 2022-02-27 DIAGNOSIS — Z87891 Personal history of nicotine dependence: Secondary | ICD-10-CM | POA: Diagnosis not present

## 2022-02-27 DIAGNOSIS — D849 Immunodeficiency, unspecified: Secondary | ICD-10-CM | POA: Diagnosis not present

## 2023-06-09 ENCOUNTER — Emergency Department (HOSPITAL_BASED_OUTPATIENT_CLINIC_OR_DEPARTMENT_OTHER)
Admission: EM | Admit: 2023-06-09 | Discharge: 2023-06-09 | Disposition: A | Discharge status: Home / Self Care | Payer: 59 | Attending: Emergency Medicine | Admitting: Emergency Medicine

## 2023-06-09 ENCOUNTER — Encounter (HOSPITAL_BASED_OUTPATIENT_CLINIC_OR_DEPARTMENT_OTHER): Payer: Self-pay | Admitting: Emergency Medicine

## 2023-06-09 ENCOUNTER — Emergency Department (HOSPITAL_BASED_OUTPATIENT_CLINIC_OR_DEPARTMENT_OTHER): Payer: 59 | Admitting: Radiology

## 2023-06-09 ENCOUNTER — Other Ambulatory Visit: Payer: Self-pay

## 2023-06-09 DIAGNOSIS — R079 Chest pain, unspecified: Secondary | ICD-10-CM | POA: Insufficient documentation

## 2023-06-09 DIAGNOSIS — Z79899 Other long term (current) drug therapy: Secondary | ICD-10-CM | POA: Diagnosis not present

## 2023-06-09 LAB — BASIC METABOLIC PANEL
Anion gap: 9 (ref 5–15)
BUN: 17 mg/dL (ref 6–20)
CO2: 25 mmol/L (ref 22–32)
Calcium: 9.3 mg/dL (ref 8.9–10.3)
Chloride: 107 mmol/L (ref 98–111)
Creatinine, Ser: 0.73 mg/dL (ref 0.44–1.00)
GFR, Estimated: >60 mL/min (ref >60)
Glucose, Bld: 101 mg/dL — ABNORMAL HIGH (ref 70–99)
Potassium: 4.1 mmol/L (ref 3.5–5.1)
Sodium: 141 mmol/L (ref 135–145)

## 2023-06-09 LAB — CBC
HCT: 42.2 % (ref 36.0–46.0)
Hemoglobin: 13.6 g/dL (ref 12.0–15.0)
MCH: 27.7 pg (ref 26.0–34.0)
MCHC: 32.2 g/dL (ref 30.0–36.0)
MCV: 85.9 fL (ref 80.0–100.0)
Platelets: 379 10*3/uL (ref 150–400)
RBC: 4.91 MIL/uL (ref 3.87–5.11)
RDW: 15.6 % — ABNORMAL HIGH (ref 11.5–15.5)
WBC: 7 10*3/uL (ref 4.0–10.5)
nRBC: 0 % (ref 0.0–0.2)

## 2023-06-09 LAB — TROPONIN I (HIGH SENSITIVITY): Troponin I (High Sensitivity): <2 ng/L (ref <18)

## 2023-06-09 NOTE — ED Provider Notes (Signed)
[place] EMERGENCY DEPARTMENT AT [facility] Provider Note   CSN: [id number] Arrival date & time: 06/09/23  1321     History  Chief Complaint  Patient presents with   Chest Pain    Linda Meyers is a 47 y.o. female.  47 yo F with a cc of chest pain.  Going on since last night.  Nothing seems to make it better or worse.  Under a lot of stress. Not exertional.    Patient denies history of MI, denies hypertension hyperlipidemia or diabetes.  Denies family history of MI. Smokes marijuana.   Patient denies history of PE or DVT denies hemoptysis denies unilateral lower extremity edema denies recent surgery immobilization hospitalization estrogen use or history of cancer.     Chest Pain      Home Medications Prior to Admission medications   Medication Sig Start Date End Date Taking? Authorizing Provider  cholecalciferol (VITAMIN D) 1000 units tablet Take 1,000 Units by mouth every evening.    [provider]  ciclopirox (PENLAC) 8 % solution Apply topically at bedtime. Apply over nail and surrounding skin. Apply daily over previous coat. Remove weekly with file or polish remover. 11/27/20   Park Liter, DPM  clindamycin (CLEOCIN T) 1 % external solution Apply a thin layer to the groin every morning. Do not rinse off. 01/13/19   [provider]  clindamycin-benzoyl peroxide (BENZACLIN) gel Apply daily to affected area 01/13/19   [provider]  Coenzyme Q10 (COQ10) 100 MG CAPS Take 100 mg by mouth every evening.    [provider]  diphenhydramine-acetaminophen (TYLENOL PM) 25-500 MG TABS tablet Take 1 tablet by mouth at bedtime as needed (for pain).    [provider]  Flaxseed, Linseed, (FLAXSEED OIL) 1000 MG CAPS Take 1,000 mg by mouth every evening.    [provider]  fluconazole (DIFLUCAN) 150 MG tablet Take 1 tablet (150 mg total) by mouth once a week. 11/27/20   Park Liter, DPM  ibuprofen (ADVIL) 800  MG tablet Take by mouth. 05/21/18   [provider]  metroNIDAZOLE (VANDAZOLE) 0.75 % vaginal gel  07/27/18   [provider]  Multiple Vitamin (MULTIVITAMIN) capsule Take by mouth.    [provider]  Multiple Vitamins-Minerals (ANTIOXIDANT VITAMIN/MINERAL) CAPS Take 1 capsule by mouth every evening.    [provider]  naproxen sodium (ANAPROX) 220 MG tablet Take 440 mg by mouth at bedtime as needed (for pain).    [provider]  Nutritional Supplements (GRAPESEED EXTRACT) 500-50 MG CAPS Take 500 mg by mouth every evening.    [provider]  omega-3 acid ethyl esters (LOVAZA) 1 g capsule Take by mouth.    [provider]  Omega-3 Fatty Acids (FISH OIL) 1000 MG CAPS Take 1,000 mg by mouth every evening.    [provider]  VALTREX 1 g tablet Take 1 g by mouth daily as needed. For outbreaks. 08/04/16   [provider]  vitamin A 8000 UNIT capsule Take 8,000 Units by mouth every evening.    [provider]  vitamin C (ASCORBIC ACID) 500 MG tablet Take 500 mg by mouth every evening.    [provider]      Allergies    Lortab [hydrocodone-acetaminophen], Percocet [oxycodone-acetaminophen], and Propoxyphene    Review of Systems   Review of Systems  Cardiovascular:  Positive for chest pain.    Physical Exam Updated Vital Signs BP 116/74 (BP Location: Right Arm)  Pulse 86   Temp 98.7 F (37.1 C) (Oral)   Resp 20   Ht 5\' 7"  (1.702 m)   Wt 83.9 kg   LMP 06/02/2023   SpO2 95%   BMI 28.98 kg/m  Physical Exam Vitals and nursing note reviewed.  Constitutional:      General: She is not in acute distress.    Appearance: She is well-developed. She is not diaphoretic.  HENT:     Head: Normocephalic and atraumatic.  Eyes:     Pupils: Pupils are equal, round, and reactive to light.  Cardiovascular:     Rate and Rhythm: Normal rate and regular rhythm.     Heart sounds: No murmur heard.    No  friction rub. No gallop.  Pulmonary:     Effort: Pulmonary effort is normal.     Breath sounds: No wheezing or rales.  Chest:     Comments: No obvious pain on palpation.  Abdominal:     General: There is no distension.     Palpations: Abdomen is soft.     Tenderness: There is no abdominal tenderness.  Musculoskeletal:        General: No tenderness.     Cervical back: Normal range of motion and neck supple.  Skin:    General: Skin is warm and dry.  Neurological:     Mental Status: She is alert and oriented to person, place, and time.  Psychiatric:        Behavior: Behavior normal.     ED Results / Procedures / Treatments   Labs (all labs ordered are listed, but only abnormal results are displayed) Labs Reviewed  BASIC METABOLIC PANEL - Abnormal; Notable for the following components:      Result Value   Glucose, Bld 101 (*)    All other components within normal limits  CBC - Abnormal; Notable for the following components:   RDW 15.6 (*)    All other components within normal limits  TROPONIN I (HIGH SENSITIVITY)    EKG EKG Interpretation Date/Time:  Tuesday June 09 2023 13:35:47 EST Ventricular Rate:  93 PR Interval:  169 QRS Duration:  86 QT Interval:  340 QTC Calculation: 423 R Axis:   23  Text Interpretation: Sinus rhythm Biatrial enlargement RSR' in V1 or V2, probably normal variant No old tracing to compare Confirmed by Melene Plan [phone]) on 06/09/2023 1:49:40 PM  Radiology No results found.  Procedures Procedures    Medications Ordered in ED Medications - No data to display  ED Course/ Medical Decision Making/ A&P                                 Medical Decision Making Amount and/or Complexity of Data Reviewed Labs: ordered. Radiology: ordered.   47 yo F with a chief complaints of chest pain.  This been going on since last night.  No significant change in the past 6 hours.  Will obtain a single troponin chest x-ray.  Patient is PERC  negative.  Trop negative, no acute anemia, no significant electrolyte abnormality.  CXR independently interpreted by me without focal infiltrate or ptx.   2:54 PM:  I have discussed the diagnosis/risks/treatment options with the patient.  Evaluation and diagnostic testing in the emergency department does not suggest an emergent condition requiring admission or immediate intervention beyond what has been performed at this time.  They will follow up with PCP. We also discussed  returning to the ED immediately if new or worsening sx occur. We discussed the sx which are most concerning (e.g., sudden worsening pain, fever, inability to tolerate by mouth) that necessitate immediate return. Medications administered to the patient during their visit and any new prescriptions provided to the patient are listed below.  Medications given during this visit Medications - No data to display   The patient appears reasonably screen and/or stabilized for discharge and I doubt any other medical condition or other [facility] requiring further screening, evaluation, or treatment in the ED at this time prior to discharge.          Final Clinical Impression(s) / ED Diagnoses Final diagnoses:  Nonspecific chest pain    Rx / DC Orders ED Discharge Orders     None         Melene Plan, DO 06/09/23 1454

## 2023-06-09 NOTE — ED Triage Notes (Signed)
Pt caox4 ambulatory c/o CP described as a pinch with a stiff pain in the L arm that started last night. Pt denies SOB, N/V, dizziness, or any other complaint.

## 2023-06-09 NOTE — Discharge Instructions (Signed)
Based on the test we did today it is unlikely that your symptoms were caused by a heart attack or a blood clot in the lung.  You did not have an obvious pneumonia or collapsed lung on your chest x-ray.  Please return for worsening pain if you cough up blood or if you pass out.  Please follow-up with your family doctor in the office.  I am going to treat this like maybe it is muscular pain max dosing of the over-the-counter medications are listed below.  Take 4 over the counter ibuprofen tablets 3 times a day or 2 over-the-counter naproxen tablets twice a day for pain. Also take tylenol 1000mg (2 extra strength) four times a day.

## 2023-09-14 IMAGING — CR DG KNEE 1-2V*R*
2 series · 2 of 2 positions shown · non-contrast
Comparison: None Available.

CLINICAL DATA: Posteromedial right knee pain.

EXAM:
RIGHT KNEE - 1-2 VIEW

[w knee ap right]
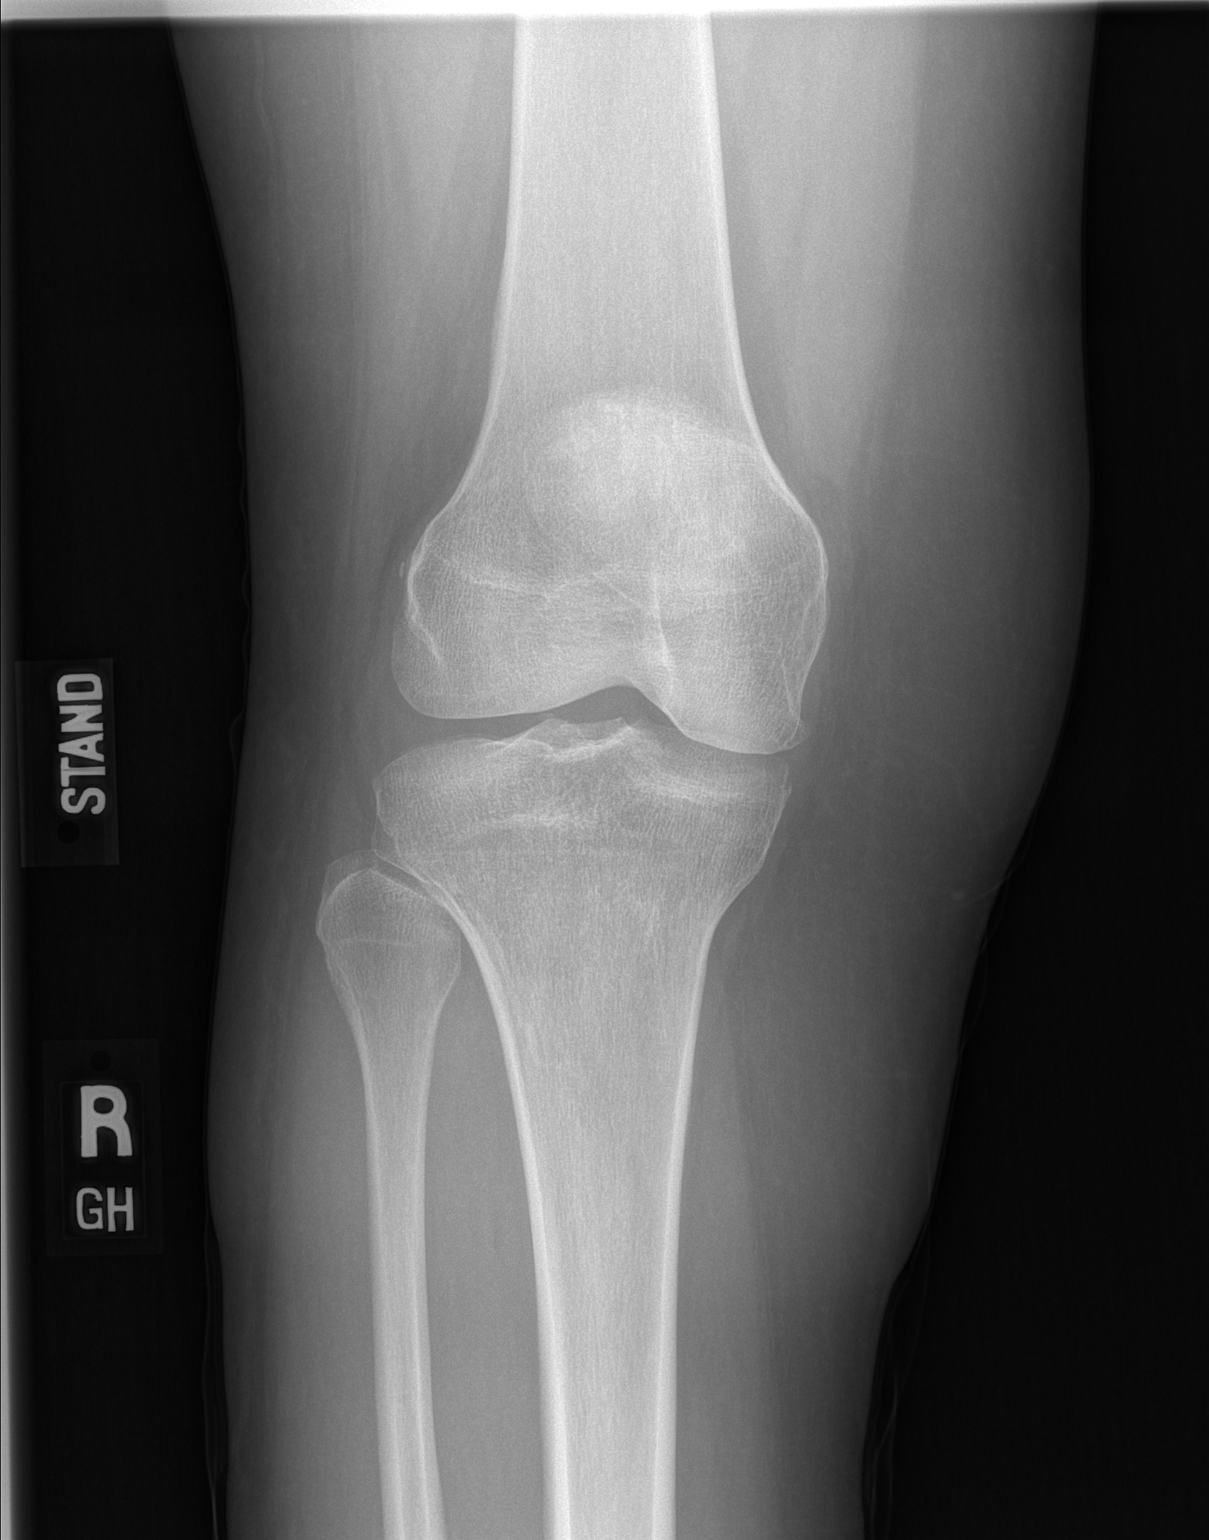

[w knee lat. right]
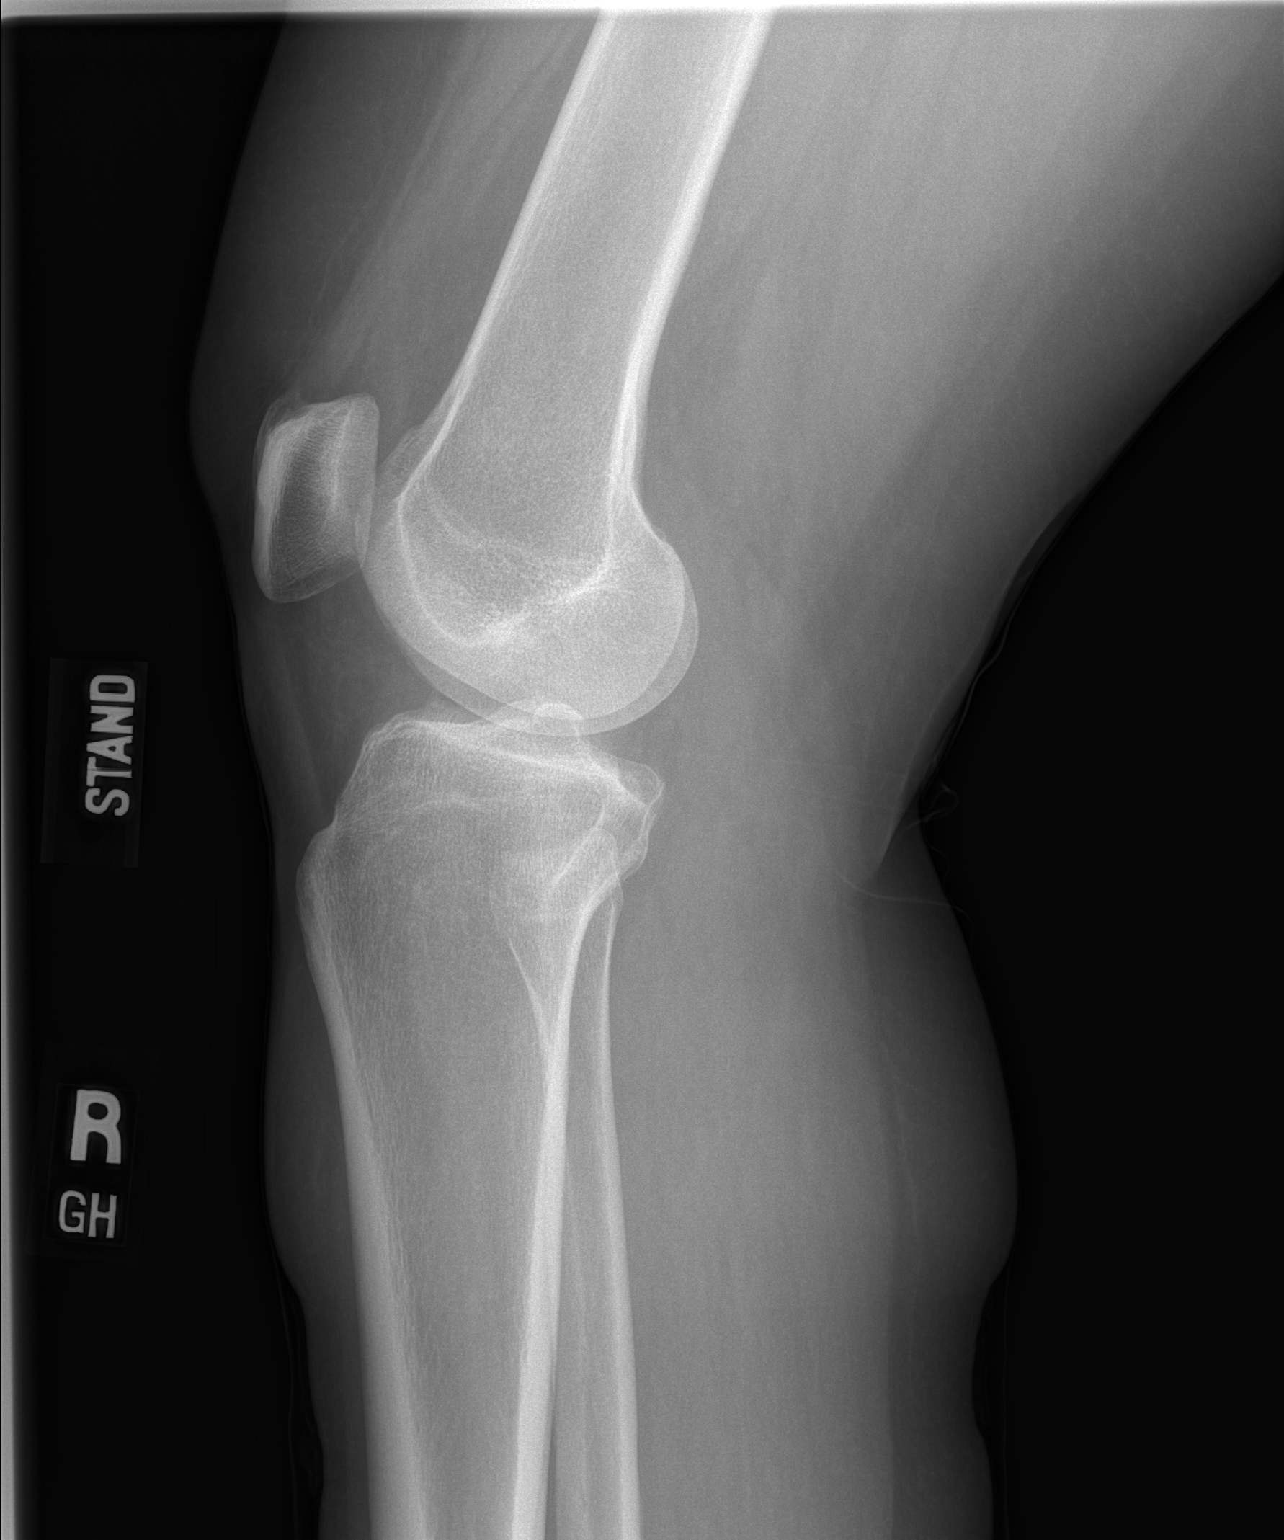

[2 of 2 positions shown; findings below may reference images not displayed]

FINDINGS: Mild medial joint space narrowing and peripheral osteophytosis.
Minimal chronic enthesopathic change at the quadriceps insertion on
the patella. Tiny joint effusion. No acute fracture is seen. No
dislocation.
IMPRESSION: Mild medial compartment osteoarthritis.

## 2023-12-27 ENCOUNTER — Emergency Department (HOSPITAL_BASED_OUTPATIENT_CLINIC_OR_DEPARTMENT_OTHER)

## 2023-12-27 ENCOUNTER — Other Ambulatory Visit: Payer: Self-pay

## 2023-12-27 ENCOUNTER — Inpatient Hospital Stay (HOSPITAL_BASED_OUTPATIENT_CLINIC_OR_DEPARTMENT_OTHER)
Admission: EM | Admit: 2023-12-27 | Discharge: 2023-12-30 | DRG: 493 | Disposition: A | Discharge status: Home Health | Attending: Internal Medicine | Admitting: Internal Medicine

## 2023-12-27 DIAGNOSIS — S82831A Other fracture of upper and lower end of right fibula, initial encounter for closed fracture: Principal | ICD-10-CM | POA: Diagnosis present

## 2023-12-27 DIAGNOSIS — S82209A Unspecified fracture of shaft of unspecified tibia, initial encounter for closed fracture: Secondary | ICD-10-CM | POA: Diagnosis not present

## 2023-12-27 DIAGNOSIS — L709 Acne, unspecified: Secondary | ICD-10-CM | POA: Diagnosis present

## 2023-12-27 DIAGNOSIS — S82451A Displaced comminuted fracture of shaft of right fibula, initial encounter for closed fracture: Secondary | ICD-10-CM

## 2023-12-27 DIAGNOSIS — Y9351 Activity, roller skating (inline) and skateboarding: Secondary | ICD-10-CM

## 2023-12-27 DIAGNOSIS — M069 Rheumatoid arthritis, unspecified: Secondary | ICD-10-CM | POA: Diagnosis present

## 2023-12-27 DIAGNOSIS — F419 Anxiety disorder, unspecified: Secondary | ICD-10-CM | POA: Diagnosis present

## 2023-12-27 DIAGNOSIS — Z79899 Other long term (current) drug therapy: Secondary | ICD-10-CM

## 2023-12-27 DIAGNOSIS — W19XXXA Unspecified fall, initial encounter: Secondary | ICD-10-CM | POA: Diagnosis present

## 2023-12-27 DIAGNOSIS — Z885 Allergy status to narcotic agent status: Secondary | ICD-10-CM

## 2023-12-27 DIAGNOSIS — Y92331 Roller skating rink as the place of occurrence of the external cause: Secondary | ICD-10-CM

## 2023-12-27 DIAGNOSIS — D62 Acute posthemorrhagic anemia: Secondary | ICD-10-CM | POA: Diagnosis not present

## 2023-12-27 DIAGNOSIS — S82251A Displaced comminuted fracture of shaft of right tibia, initial encounter for closed fracture: Principal | ICD-10-CM

## 2023-12-27 DIAGNOSIS — Z7982 Long term (current) use of aspirin: Secondary | ICD-10-CM

## 2023-12-27 DIAGNOSIS — S82301A Unspecified fracture of lower end of right tibia, initial encounter for closed fracture: Secondary | ICD-10-CM | POA: Diagnosis present

## 2023-12-27 DIAGNOSIS — S82409A Unspecified fracture of shaft of unspecified fibula, initial encounter for closed fracture: Secondary | ICD-10-CM | POA: Diagnosis present

## 2023-12-27 DIAGNOSIS — Z888 Allergy status to other drugs, medicaments and biological substances status: Secondary | ICD-10-CM

## 2023-12-27 DIAGNOSIS — I7300 Raynaud's syndrome without gangrene: Secondary | ICD-10-CM | POA: Diagnosis present

## 2023-12-27 DIAGNOSIS — M329 Systemic lupus erythematosus, unspecified: Secondary | ICD-10-CM | POA: Diagnosis present

## 2023-12-27 MED ORDER — FENTANYL CITRATE PF 50 MCG/ML IJ SOSY
50.0000 ug | PREFILLED_SYRINGE | Freq: Once | INTRAMUSCULAR | Status: AC
Start: 2023-12-27 — End: 2023-12-27
  Administered 2023-12-27: 50 ug via INTRAVENOUS
  Filled 2023-12-27: qty 1

## 2023-12-27 MED ORDER — FENTANYL CITRATE PF 50 MCG/ML IJ SOSY
50.0000 ug | PREFILLED_SYRINGE | Freq: Once | INTRAMUSCULAR | Status: AC
Start: 2023-12-28 — End: 2023-12-28
  Administered 2023-12-28: 50 ug via INTRAVENOUS
  Filled 2023-12-27: qty 1

## 2023-12-27 NOTE — Discharge Instructions (Addendum)
Nutrition Post Hospital Stay Proper nutrition can help your body recover from illness and injury.   Foods and beverages high in protein, vitamins, and minerals help rebuild muscle loss, promote healing, & reduce fall risk.   .In addition to eating healthy foods, a nutrition shake is an easy, delicious way to get the nutrition you need during and after your hospital stay  It is recommended that you continue to drink 2 bottles per day of:       Ensure for at least 1 month (30 days) after your hospital stay   Tips for adding a nutrition shake into your routine: As allowed, drink one with vitamins or medications instead of water or juice Enjoy one as a tasty mid-morning or afternoon snack Drink cold or make a milkshake out of it Drink one instead of milk with cereal or snacks Use as a coffee creamer   Available at the following grocery stores and pharmacies:           * [employer] * [employer] * [employer]  * [employer]          * [employer] * [employer]  * [facility]      * Target  * [employer]   * [facility]  * [employer]   *  Outpatient Pharmacy [phone]            For COUPONS visit: [email or web address]/join or [email or web address]   Suggested Substitutions Ensure Plus = Boost Plus = Carnation Breakfast Essentials = Boost Compact Ensure Active Clear = Boost Breeze Glucerna Shake = Boost Glucose Control = Carnation Breakfast Essentials SUGAR FREE       

## 2023-12-27 NOTE — ED Triage Notes (Signed)
 Pt BIB [facility] reporting R ankle after falling while skating. Unable to bear weight on R foot, 100 fentanyl  given PTA

## 2023-12-27 NOTE — ED Provider Notes (Signed)
 [facility] Meyers EMERGENCY DEPARTMENT AT [facility] Provider Note   CSN: [id number] Arrival date & time: 12/27/23  2250     Patient presents with: Ankle Pain   Linda Meyers is a 48 y.o. female.  {Add pertinent medical, surgical, social history, OB history to HPI:32947}  Ankle Pain    48 year old female presenting to the emergency department with right leg pain after injuring it while skating.  The patient states that she was rollerskating and sustained a twisting mechanism and fell immediately sustaining sharp pain in her right lower extremity.  She has been unable to bear weight on the right foot.  She arrives with EMS with 100 mcg of fentanyl  given prior to arrival.  She arrives with an intact DP pulse to the right foot with pain in the right lower extremity just above the ankle.  She was not wearing a helmet but denies head trauma or any other injury.  She arrives GCS 15, ABC intact.  Prior to Admission medications   Medication Sig Start Date End Date Taking? Authorizing Provider  cholecalciferol  (VITAMIN D ) 1000 units tablet Take 1,000 Units by mouth every evening.    [provider]  ciclopirox  (PENLAC ) 8 % solution Apply topically at bedtime. Apply over nail and surrounding skin. Apply daily over previous coat. Remove weekly with file or polish remover. 11/27/20   Price, Michael J, DPM  clindamycin (CLEOCIN T) 1 % external solution Apply a thin layer to the groin every morning. Do not rinse off. 01/13/19   [provider]  clindamycin-benzoyl peroxide (BENZACLIN) gel Apply daily to affected area 01/13/19   [provider]  Coenzyme Q10 (COQ10) 100 MG CAPS Take 100 mg by mouth every evening.    [provider]  diphenhydramine-acetaminophen (TYLENOL PM) 25-500 MG TABS tablet Take 1 tablet by mouth at bedtime as needed (for pain).    [provider]  Flaxseed, Linseed, (FLAXSEED OIL) 1000 MG CAPS Take 1,000 mg by mouth every evening.     [provider]  fluconazole  (DIFLUCAN ) 150 MG tablet Take 1 tablet (150 mg total) by mouth once a week. 11/27/20   Price, Michael J, DPM  ibuprofen (ADVIL) 800 MG tablet Take by mouth. 05/21/18   [provider]  metroNIDAZOLE (VANDAZOLE) 0.75 % vaginal gel  07/27/18   [provider]  Multiple Vitamin (MULTIVITAMIN) capsule Take by mouth.    [provider]  Multiple Vitamins-Minerals (ANTIOXIDANT VITAMIN/MINERAL) CAPS Take 1 capsule by mouth every evening.    [provider]  naproxen  sodium (ANAPROX ) 220 MG tablet Take 440 mg by mouth at bedtime as needed (for pain).    [provider]  Nutritional Supplements (GRAPESEED EXTRACT) 500-50 MG CAPS Take 500 mg by mouth every evening.    [provider]  omega-3 acid ethyl esters (LOVAZA) 1 g capsule Take by mouth.    [provider]  Omega-3 Fatty Acids (FISH OIL) 1000 MG CAPS Take 1,000 mg by mouth every evening.    [provider]  VALTREX 1 g tablet Take 1 g by mouth daily as needed. For outbreaks. 08/04/16   [provider]  vitamin A  8000 UNIT capsule Take 8,000 Units by mouth every evening.    [provider]  vitamin C  (ASCORBIC ACID ) 500 MG tablet Take 500 mg by mouth every evening.    [provider]    Allergies: Lortab [hydrocodone-acetaminophen], Percocet [oxycodone-acetaminophen], and Propoxyphene    Review of Systems  All other systems reviewed  and are negative.   Updated Vital Signs BP 114/71   Pulse 78   Temp 98 F (36.7 C) (Temporal)   Resp 18   SpO2 99%   Physical Exam Vitals and nursing note reviewed.  Constitutional:      General: She is not in acute distress. HENT:     Head: Normocephalic and atraumatic.   Eyes:     Conjunctiva/sclera: Conjunctivae normal.     Pupils: Pupils are equal, round, and reactive to light.    Cardiovascular:     Rate and Rhythm: Normal rate and regular rhythm.  Pulmonary:      Effort: Pulmonary effort is normal. No respiratory distress.  Abdominal:     General: There is no distension.     Tenderness: There is no guarding.   Musculoskeletal:        General: Swelling, tenderness and signs of injury present.     Cervical back: Neck supple.     Comments: Right ankle with no medial or lateral malleoli or tenderness, 2+ DP pulses, tenderness of the lower right leg just above the ankle with associated swelling   Skin:    Findings: No lesion or rash.   Neurological:     General: No focal deficit present.     Mental Status: She is alert. Mental status is at baseline.     (all labs ordered are listed, but only abnormal results are displayed) Labs Reviewed - No data to display  EKG: None  Radiology: No results found.  {Document cardiac monitor, telemetry assessment procedure when appropriate:32947} Procedures   Medications Ordered in the ED - No data to display    {Click here for ABCD2, HEART and other calculators REFRESH Note before signing:1}                              Medical Decision Making Amount and/or Complexity of Data Reviewed Radiology: ordered.  Risk Prescription drug management.    48 year old female presenting to the emergency department with right leg pain after injuring it while skating.  The patient states that she was rollerskating and sustained a twisting mechanism and fell immediately sustaining sharp pain in her right lower extremity.  She has been unable to bear weight on the right foot.  She arrives with EMS with 100 mcg of fentanyl  given prior to arrival.  She arrives with an intact DP pulse to the right foot with pain in the right lower extremity just above the ankle.  She was not wearing a helmet but denies head trauma or any other injury.  She arrives GCS 15, ABC intact.  On arrival, the patient was vitally stable, neurovascularly intact in the right lower extremity with pain swelling and deformity in the setting of an injury  sustained while rollerblading earlier today.  Concern for fracture.  XR Right Ankle: IMPRESSION:  1. Displaced and comminuted distal tibial shaft fracture.  2. Displaced distal fibular shaft fracture.     Orthopedics consulted and the patient was administered an additional 50 mcg of IV fentanyl .  Spoke with Dr. PIERRETTE  {Document critical care time when appropriate  Document review of labs and clinical decision tools ie CHADS2VASC2, etc  Document your independent review of radiology images and any outside records  Document your discussion with family members, caretakers and with consultants  Document social determinants of health affecting pt's care  Document your decision making why or why not admission, treatments were needed:32947:::1}  Final diagnoses:  None    ED Discharge Orders     None

## 2023-12-27 NOTE — ED Notes (Signed)
 X-Ray at bedside.

## 2023-12-28 ENCOUNTER — Inpatient Hospital Stay (HOSPITAL_COMMUNITY): Admitting: Anesthesiology

## 2023-12-28 ENCOUNTER — Other Ambulatory Visit: Payer: Self-pay

## 2023-12-28 ENCOUNTER — Inpatient Hospital Stay (HOSPITAL_COMMUNITY)

## 2023-12-28 ENCOUNTER — Encounter (HOSPITAL_COMMUNITY)
Admission: EM | Disposition: A | Discharge status: Home Health | Payer: Self-pay | Source: Home / Self Care | Attending: Internal Medicine

## 2023-12-28 ENCOUNTER — Encounter (HOSPITAL_COMMUNITY): Payer: Self-pay | Admitting: Internal Medicine

## 2023-12-28 DIAGNOSIS — I7300 Raynaud's syndrome without gangrene: Secondary | ICD-10-CM | POA: Diagnosis present

## 2023-12-28 DIAGNOSIS — S82301A Unspecified fracture of lower end of right tibia, initial encounter for closed fracture: Secondary | ICD-10-CM | POA: Diagnosis present

## 2023-12-28 DIAGNOSIS — S82831A Other fracture of upper and lower end of right fibula, initial encounter for closed fracture: Secondary | ICD-10-CM | POA: Diagnosis present

## 2023-12-28 DIAGNOSIS — M329 Systemic lupus erythematosus, unspecified: Secondary | ICD-10-CM

## 2023-12-28 DIAGNOSIS — Y92331 Roller skating rink as the place of occurrence of the external cause: Secondary | ICD-10-CM | POA: Diagnosis not present

## 2023-12-28 DIAGNOSIS — W19XXXA Unspecified fall, initial encounter: Secondary | ICD-10-CM | POA: Diagnosis not present

## 2023-12-28 DIAGNOSIS — S82209A Unspecified fracture of shaft of unspecified tibia, initial encounter for closed fracture: Secondary | ICD-10-CM | POA: Diagnosis present

## 2023-12-28 DIAGNOSIS — L709 Acne, unspecified: Secondary | ICD-10-CM | POA: Diagnosis present

## 2023-12-28 DIAGNOSIS — Z79899 Other long term (current) drug therapy: Secondary | ICD-10-CM | POA: Diagnosis not present

## 2023-12-28 DIAGNOSIS — F419 Anxiety disorder, unspecified: Secondary | ICD-10-CM | POA: Diagnosis present

## 2023-12-28 DIAGNOSIS — F418 Other specified anxiety disorders: Secondary | ICD-10-CM | POA: Diagnosis not present

## 2023-12-28 DIAGNOSIS — S82409A Unspecified fracture of shaft of unspecified fibula, initial encounter for closed fracture: Secondary | ICD-10-CM | POA: Diagnosis present

## 2023-12-28 DIAGNOSIS — Y9351 Activity, roller skating (inline) and skateboarding: Secondary | ICD-10-CM | POA: Diagnosis not present

## 2023-12-28 DIAGNOSIS — D62 Acute posthemorrhagic anemia: Secondary | ICD-10-CM | POA: Diagnosis not present

## 2023-12-28 DIAGNOSIS — Z885 Allergy status to narcotic agent status: Secondary | ICD-10-CM | POA: Diagnosis not present

## 2023-12-28 DIAGNOSIS — M069 Rheumatoid arthritis, unspecified: Secondary | ICD-10-CM | POA: Diagnosis present

## 2023-12-28 DIAGNOSIS — Z888 Allergy status to other drugs, medicaments and biological substances status: Secondary | ICD-10-CM | POA: Diagnosis not present

## 2023-12-28 DIAGNOSIS — Z7982 Long term (current) use of aspirin: Secondary | ICD-10-CM | POA: Diagnosis not present

## 2023-12-28 HISTORY — PX: TIBIA IM NAIL INSERTION: SHX2516

## 2023-12-28 SURGERY — INSERTION, INTRAMEDULLARY ROD, TIBIA
Anesthesia: General | Laterality: Right

## 2023-12-28 MED ORDER — ACETAMINOPHEN 500 MG PO TABS: 500.0000 mg | ORAL_TABLET | Freq: Every evening | ORAL | Status: DC | PRN

## 2023-12-28 MED ORDER — OXYCODONE HCL 5 MG PO TABS: 10.0000 mg | ORAL_TABLET | ORAL | Status: DC | PRN

## 2023-12-28 MED ORDER — PROPOFOL 10 MG/ML IV BOLUS
INTRAVENOUS | Status: AC
  Filled 2023-12-28: qty 20

## 2023-12-28 MED ORDER — SODIUM CHLORIDE 0.9% FLUSH: 3.0000 mL | INTRAVENOUS | Status: DC | PRN

## 2023-12-28 MED ORDER — DEXAMETHASONE SODIUM PHOSPHATE 10 MG/ML IJ SOLN
INTRAMUSCULAR | Status: AC
  Filled 2023-12-28: qty 1

## 2023-12-28 MED ORDER — CHLORHEXIDINE GLUCONATE 0.12 % MT SOLN
15.0000 mL | Freq: Once | OROMUCOSAL | Status: AC
  Administered 2023-12-28: 15 mL via OROMUCOSAL
  Filled 2023-12-28: qty 15

## 2023-12-28 MED ORDER — ORAL CARE MOUTH RINSE: 15.0000 mL | Freq: Once | OROMUCOSAL | Status: AC

## 2023-12-28 MED ORDER — MIDAZOLAM HCL 2 MG/2ML IJ SOLN
INTRAMUSCULAR | Status: AC
  Filled 2023-12-28: qty 2

## 2023-12-28 MED ORDER — METOCLOPRAMIDE HCL 5 MG PO TABS: 5.0000 mg | ORAL_TABLET | Freq: Three times a day (TID) | ORAL | Status: DC | PRN

## 2023-12-28 MED ORDER — FENTANYL CITRATE PF 50 MCG/ML IJ SOSY: 25.0000 ug | PREFILLED_SYRINGE | INTRAMUSCULAR | Status: DC | PRN

## 2023-12-28 MED ORDER — 0.9 % SODIUM CHLORIDE (POUR BTL) OPTIME
TOPICAL | Status: DC | PRN
  Administered 2023-12-28: 1000 mL

## 2023-12-28 MED ORDER — ROCURONIUM BROMIDE 10 MG/ML (PF) SYRINGE
PREFILLED_SYRINGE | INTRAVENOUS | Status: AC
Start: 2023-12-28 — End: 2023-12-28
  Filled 2023-12-28: qty 30

## 2023-12-28 MED ORDER — HYDROMORPHONE HCL 1 MG/ML IJ SOLN
1.0000 mg | INTRAMUSCULAR | Status: AC | PRN
  Administered 2023-12-28 (×3): 1 mg via INTRAVENOUS
  Filled 2023-12-28 (×3): qty 1

## 2023-12-28 MED ORDER — CELECOXIB 200 MG PO CAPS
200.0000 mg | ORAL_CAPSULE | Freq: Two times a day (BID) | ORAL | Status: DC
  Administered 2023-12-28 – 2023-12-30 (×4): 200 mg via ORAL
  Filled 2023-12-28 (×5): qty 1

## 2023-12-28 MED ORDER — CEFAZOLIN SODIUM-DEXTROSE 2-4 GM/100ML-% IV SOLN
2.0000 g | Freq: Three times a day (TID) | INTRAVENOUS | Status: AC
  Administered 2023-12-28 – 2023-12-29 (×3): 2 g via INTRAVENOUS
  Filled 2023-12-28 (×3): qty 100

## 2023-12-28 MED ORDER — DIPHENHYDRAMINE HCL 12.5 MG/5ML PO ELIX: 12.5000 mg | ORAL_SOLUTION | ORAL | Status: DC | PRN

## 2023-12-28 MED ORDER — OXYCODONE HCL 5 MG PO TABS
5.0000 mg | ORAL_TABLET | ORAL | Status: DC | PRN
  Administered 2023-12-29 – 2023-12-30 (×2): 10 mg via ORAL
  Filled 2023-12-28 (×2): qty 2

## 2023-12-28 MED ORDER — METHOCARBAMOL 500 MG PO TABS
500.0000 mg | ORAL_TABLET | Freq: Four times a day (QID) | ORAL | Status: DC | PRN
Start: 2023-12-28 — End: 2023-12-30
  Filled 2023-12-28: qty 1

## 2023-12-28 MED ORDER — DEXAMETHASONE SODIUM PHOSPHATE 10 MG/ML IJ SOLN
INTRAMUSCULAR | Status: DC | PRN
  Administered 2023-12-28: 10 mg via INTRAVENOUS

## 2023-12-28 MED ORDER — ONDANSETRON HCL 4 MG PO TABS: 4.0000 mg | ORAL_TABLET | Freq: Four times a day (QID) | ORAL | Status: DC | PRN

## 2023-12-28 MED ORDER — CEFAZOLIN SODIUM-DEXTROSE 2-3 GM-%(50ML) IV SOLR
INTRAVENOUS | Status: DC | PRN
  Administered 2023-12-28: 2 g via INTRAVENOUS

## 2023-12-28 MED ORDER — LINACLOTIDE 72 MCG PO CAPS
72.0000 ug | ORAL_CAPSULE | Freq: Every day | ORAL | Status: DC | PRN
  Administered 2023-12-29: 72 ug via ORAL
  Filled 2023-12-28 (×2): qty 1

## 2023-12-28 MED ORDER — HYDROMORPHONE HCL 1 MG/ML IJ SOLN: 0.5000 mg | INTRAMUSCULAR | Status: DC | PRN

## 2023-12-28 MED ORDER — HYDROMORPHONE HCL 1 MG/ML IJ SOLN
INTRAMUSCULAR | Status: AC
  Filled 2023-12-28: qty 1

## 2023-12-28 MED ORDER — KETOROLAC TROMETHAMINE 30 MG/ML IJ SOLN
30.0000 mg | Freq: Once | INTRAMUSCULAR | Status: AC | PRN
  Administered 2023-12-28: 30 mg via INTRAVENOUS

## 2023-12-28 MED ORDER — POLYETHYLENE GLYCOL 3350 17 G PO PACK
17.0000 g | PACK | Freq: Every day | ORAL | Status: DC | PRN
  Administered 2023-12-29: 17 g via ORAL
  Filled 2023-12-28: qty 1

## 2023-12-28 MED ORDER — DOCUSATE SODIUM 100 MG PO CAPS
100.0000 mg | ORAL_CAPSULE | Freq: Two times a day (BID) | ORAL | Status: DC
  Administered 2023-12-28 – 2023-12-29 (×3): 100 mg via ORAL
  Filled 2023-12-28 (×4): qty 1

## 2023-12-28 MED ORDER — MIDAZOLAM HCL 2 MG/2ML IJ SOLN
INTRAMUSCULAR | Status: DC | PRN
  Administered 2023-12-28 (×2): 1 mg via INTRAVENOUS

## 2023-12-28 MED ORDER — HYDROMORPHONE HCL 1 MG/ML IJ SOLN
0.2500 mg | INTRAMUSCULAR | Status: DC | PRN
  Administered 2023-12-28 (×4): 0.5 mg via INTRAVENOUS

## 2023-12-28 MED ORDER — FENTANYL CITRATE (PF) 250 MCG/5ML IJ SOLN
INTRAMUSCULAR | Status: DC | PRN
  Administered 2023-12-28 (×4): 50 ug via INTRAVENOUS

## 2023-12-28 MED ORDER — DIPHENHYDRAMINE-APAP (SLEEP) 25-500 MG PO TABS: 1.0000 | ORAL_TABLET | Freq: Every evening | ORAL | Status: DC | PRN

## 2023-12-28 MED ORDER — FENTANYL CITRATE (PF) 250 MCG/5ML IJ SOLN
INTRAMUSCULAR | Status: AC
  Filled 2023-12-28: qty 5

## 2023-12-28 MED ORDER — ACETAMINOPHEN 500 MG PO TABS
1000.0000 mg | ORAL_TABLET | Freq: Four times a day (QID) | ORAL | Status: DC
  Administered 2023-12-28 – 2023-12-30 (×6): 1000 mg via ORAL
  Filled 2023-12-28 (×9): qty 2

## 2023-12-28 MED ORDER — SUGAMMADEX SODIUM 200 MG/2ML IV SOLN
INTRAVENOUS | Status: DC | PRN
  Administered 2023-12-28: 200 mg via INTRAVENOUS

## 2023-12-28 MED ORDER — SPIRONOLACTONE 25 MG PO TABS
100.0000 mg | ORAL_TABLET | Freq: Every day | ORAL | Status: DC
Start: 2023-12-28 — End: 2023-12-30
  Administered 2023-12-29 – 2023-12-30 (×2): 100 mg via ORAL
  Filled 2023-12-28 (×4): qty 4

## 2023-12-28 MED ORDER — ORAL CARE MOUTH RINSE: 15.0000 mL | OROMUCOSAL | Status: DC | PRN

## 2023-12-28 MED ORDER — SODIUM CHLORIDE 0.9% FLUSH
3.0000 mL | Freq: Two times a day (BID) | INTRAVENOUS | Status: DC
  Administered 2023-12-28 (×2): 10 mL via INTRAVENOUS
  Administered 2023-12-29 – 2023-12-30 (×3): 3 mL via INTRAVENOUS

## 2023-12-28 MED ORDER — LIDOCAINE 2% (20 MG/ML) 5 ML SYRINGE
INTRAMUSCULAR | Status: AC
  Filled 2023-12-28: qty 5

## 2023-12-28 MED ORDER — ACETAMINOPHEN 10 MG/ML IV SOLN
1000.0000 mg | Freq: Four times a day (QID) | INTRAVENOUS | Status: DC
  Administered 2023-12-28: 1000 mg via INTRAVENOUS

## 2023-12-28 MED ORDER — LIDOCAINE 2% (20 MG/ML) 5 ML SYRINGE
INTRAMUSCULAR | Status: DC | PRN
  Administered 2023-12-28: 60 mg via INTRAVENOUS

## 2023-12-28 MED ORDER — ONDANSETRON HCL 4 MG/2ML IJ SOLN
INTRAMUSCULAR | Status: DC | PRN
  Administered 2023-12-28: 4 mg via INTRAVENOUS

## 2023-12-28 MED ORDER — ROCURONIUM BROMIDE 10 MG/ML (PF) SYRINGE
PREFILLED_SYRINGE | INTRAVENOUS | Status: DC | PRN
  Administered 2023-12-28: 70 mg via INTRAVENOUS

## 2023-12-28 MED ORDER — ONDANSETRON HCL 4 MG/2ML IJ SOLN
4.0000 mg | Freq: Four times a day (QID) | INTRAMUSCULAR | Status: DC | PRN
  Administered 2023-12-28 – 2023-12-29 (×2): 4 mg via INTRAVENOUS
  Filled 2023-12-28 (×2): qty 2

## 2023-12-28 MED ORDER — LACTATED RINGERS IV SOLN: INTRAVENOUS | Status: DC

## 2023-12-28 MED ORDER — IMIQUIMOD 5 % EX CREA: 1.0000 | TOPICAL_CREAM | CUTANEOUS | Status: DC

## 2023-12-28 MED ORDER — SODIUM CHLORIDE 0.9 % IV SOLN: 12.5000 mg | INTRAVENOUS | Status: DC | PRN

## 2023-12-28 MED ORDER — VITAMIN D 25 MCG (1000 UNIT) PO TABS
1000.0000 [IU] | ORAL_TABLET | Freq: Every evening | ORAL | Status: DC
  Administered 2023-12-28 – 2023-12-29 (×2): 1000 [IU] via ORAL
  Filled 2023-12-28 (×2): qty 1

## 2023-12-28 MED ORDER — METOCLOPRAMIDE HCL 5 MG/ML IJ SOLN: 5.0000 mg | Freq: Three times a day (TID) | INTRAMUSCULAR | Status: DC | PRN

## 2023-12-28 MED ORDER — BUSPIRONE HCL 5 MG PO TABS: 7.5000 mg | ORAL_TABLET | Freq: Two times a day (BID) | ORAL | Status: DC

## 2023-12-28 MED ORDER — AMISULPRIDE (ANTIEMETIC) 5 MG/2ML IV SOLN
INTRAVENOUS | Status: AC
Start: 2023-12-28 — End: 2023-12-28
  Filled 2023-12-28: qty 4

## 2023-12-28 MED ORDER — ONDANSETRON HCL 4 MG/2ML IJ SOLN
INTRAMUSCULAR | Status: AC
  Filled 2023-12-28: qty 2

## 2023-12-28 MED ORDER — CEFAZOLIN SODIUM-DEXTROSE 2-4 GM/100ML-% IV SOLN
INTRAVENOUS | Status: AC
  Filled 2023-12-28: qty 100

## 2023-12-28 MED ORDER — DIPHENHYDRAMINE HCL 25 MG PO CAPS: 25.0000 mg | ORAL_CAPSULE | Freq: Every evening | ORAL | Status: DC | PRN

## 2023-12-28 MED ORDER — VANCOMYCIN HCL 1000 MG IV SOLR
INTRAVENOUS | Status: DC | PRN
  Administered 2023-12-28: 1000 mg via TOPICAL

## 2023-12-28 MED ORDER — AMISULPRIDE (ANTIEMETIC) 5 MG/2ML IV SOLN
10.0000 mg | Freq: Once | INTRAVENOUS | Status: AC | PRN
Start: 2023-12-28 — End: 2023-12-28
  Administered 2023-12-28: 10 mg via INTRAVENOUS

## 2023-12-28 MED ORDER — OXYCODONE HCL 5 MG PO TABS
5.0000 mg | ORAL_TABLET | ORAL | Status: DC | PRN
  Administered 2023-12-29: 5 mg via ORAL
  Filled 2023-12-28: qty 1

## 2023-12-28 MED ORDER — HYDROMORPHONE HCL 1 MG/ML IJ SOLN
INTRAMUSCULAR | Status: AC
Start: 2023-12-28 — End: 2023-12-28
  Filled 2023-12-28: qty 1

## 2023-12-28 MED ORDER — ENOXAPARIN SODIUM 40 MG/0.4ML IJ SOSY
40.0000 mg | PREFILLED_SYRINGE | INTRAMUSCULAR | Status: DC
  Administered 2023-12-28 – 2023-12-29 (×2): 40 mg via SUBCUTANEOUS
  Filled 2023-12-28 (×2): qty 0.4

## 2023-12-28 MED ORDER — METHOCARBAMOL 1000 MG/10ML IJ SOLN: 500.0000 mg | Freq: Four times a day (QID) | INTRAMUSCULAR | Status: DC | PRN

## 2023-12-28 MED ORDER — PROPOFOL 10 MG/ML IV BOLUS
INTRAVENOUS | Status: DC | PRN
  Administered 2023-12-28: 150 mg via INTRAVENOUS

## 2023-12-28 MED ORDER — HYDROMORPHONE HCL 1 MG/ML IJ SOLN
0.2500 mg | INTRAMUSCULAR | Status: DC | PRN
  Administered 2023-12-28: 0.5 mg via INTRAVENOUS

## 2023-12-28 MED ORDER — VANCOMYCIN HCL 1000 MG IV SOLR
INTRAVENOUS | Status: AC
  Filled 2023-12-28: qty 20

## 2023-12-28 MED ORDER — KETOROLAC TROMETHAMINE 30 MG/ML IJ SOLN
INTRAMUSCULAR | Status: AC
  Filled 2023-12-28: qty 1

## 2023-12-28 MED ORDER — ASPIRIN 325 MG PO TABS
325.0000 mg | ORAL_TABLET | Freq: Every day | ORAL | Status: DC
  Administered 2023-12-28 – 2023-12-30 (×3): 325 mg via ORAL
  Filled 2023-12-28 (×3): qty 1

## 2023-12-28 MED ORDER — MEPERIDINE HCL 25 MG/ML IJ SOLN: 6.2500 mg | INTRAMUSCULAR | Status: DC | PRN

## 2023-12-28 MED ORDER — ACETAMINOPHEN 10 MG/ML IV SOLN
INTRAVENOUS | Status: AC
  Filled 2023-12-28: qty 100

## 2023-12-28 SURGICAL SUPPLY — 52 items
BAG COUNTER SPONGE SURGICOUNT (BAG) ×1 IMPLANT
BENZOIN TINCTURE PRP APPL 2/3 (GAUZE/BANDAGES/DRESSINGS) IMPLANT
BIT DRILL CROWE POINT TWST 4.3 (DRILL) IMPLANT
BLADE SURG 10 STRL SS (BLADE) ×2 IMPLANT
BNDG COHESIVE 4X5 TAN STRL LF (GAUZE/BANDAGES/DRESSINGS) ×1 IMPLANT
BNDG ELASTIC 4INX 5YD STR LF (GAUZE/BANDAGES/DRESSINGS) IMPLANT
BNDG ELASTIC 4X5.8 VLCR STR LF (GAUZE/BANDAGES/DRESSINGS) ×1 IMPLANT
BNDG ELASTIC 6INX 5YD STR LF (GAUZE/BANDAGES/DRESSINGS) ×1 IMPLANT
BNDG GAUZE DERMACEA FLUFF 4 (GAUZE/BANDAGES/DRESSINGS) ×1 IMPLANT
BRUSH SCRUB EZ PLAIN DRY (MISCELLANEOUS) ×2 IMPLANT
CHLORAPREP W/TINT 26 (MISCELLANEOUS) ×1 IMPLANT
CLSR STERI-STRIP ANTIMIC 1/2X4 (GAUZE/BANDAGES/DRESSINGS) IMPLANT
COVER SURGICAL LIGHT HANDLE (MISCELLANEOUS) ×2 IMPLANT
DRAPE C-ARM 42X72 X-RAY (DRAPES) ×1 IMPLANT
DRAPE C-ARMOR (DRAPES) ×1 IMPLANT
DRAPE HALF SHEET 40X57 (DRAPES) ×2 IMPLANT
DRAPE IMP U-DRAPE 54X76 (DRAPES) ×2 IMPLANT
DRAPE INCISE IOBAN 66X45 STRL (DRAPES) IMPLANT
DRAPE SURG ORHT 6 SPLT 77X108 (DRAPES) ×2 IMPLANT
DRAPE U-SHAPE 47X51 STRL (DRAPES) ×1 IMPLANT
DRILL SHORT 4.3 INTERLOCK (DRILL) IMPLANT
DRSG ADAPTIC 3X8 NADH LF (GAUZE/BANDAGES/DRESSINGS) ×1 IMPLANT
DRSG MEPITEL 4X7.2 (GAUZE/BANDAGES/DRESSINGS) IMPLANT
ELECT PENCIL ROCKER SW 15FT (MISCELLANEOUS) IMPLANT
ELECTRODE REM PT RTRN 9FT ADLT (ELECTROSURGICAL) ×1 IMPLANT
GAUZE SPONGE 4X4 12PLY STRL (GAUZE/BANDAGES/DRESSINGS) ×1 IMPLANT
GLOVE BIO SURGEON STRL SZ 6.5 (GLOVE) ×3 IMPLANT
GLOVE BIO SURGEON STRL SZ7.5 (GLOVE) ×4 IMPLANT
GLOVE BIOGEL PI IND STRL 6.5 (GLOVE) ×1 IMPLANT
GLOVE BIOGEL PI IND STRL 7.5 (GLOVE) ×1 IMPLANT
GOWN STRL REUS W/ TWL LRG LVL3 (GOWN DISPOSABLE) ×2 IMPLANT
GUIDEPIN VERSANAIL DSP 3.2X444 (ORTHOPEDIC DISPOSABLE SUPPLIES) IMPLANT
GUIDWIRE 80 BEAD TIP (WIRE) IMPLANT
KIT BASIN OR (CUSTOM PROCEDURE TRAY) ×1 IMPLANT
KIT TURNOVER KIT B (KITS) ×1 IMPLANT
NAIL TIBIA AFFIX ST 10X360 (Nail) IMPLANT
PACK TOTAL JOINT (CUSTOM PROCEDURE TRAY) ×1 IMPLANT
PAD ARMBOARD POSITIONER FOAM (MISCELLANEOUS) ×2 IMPLANT
PADDING CAST ABS COTTON 4X4 ST (CAST SUPPLIES) IMPLANT
PADDING CAST ABS COTTON 6X4 NS (CAST SUPPLIES) IMPLANT
SCREW CORT AFFIX ST 5X34 (Screw) IMPLANT
SCREW CORT AFFIX ST 5X40 (Screw) IMPLANT
SCREW CORT AFFIX ST 5X44 (Screw) IMPLANT
SCREW CORT ST AFFIX 5X32 (Screw) IMPLANT
STAPLER SKIN PROX 35W (STAPLE) ×1 IMPLANT
SUT MNCRL AB 3-0 PS2 18 (SUTURE) ×1 IMPLANT
SUT MON AB 2-0 CT1 36 (SUTURE) IMPLANT
SUT VIC AB 0 CT1 27XBRD ANBCTR (SUTURE) IMPLANT
SUT VIC AB 2-0 CT1 TAPERPNT 27 (SUTURE) IMPLANT
TOWEL GREEN STERILE (TOWEL DISPOSABLE) ×2 IMPLANT
TOWEL GREEN STERILE FF (TOWEL DISPOSABLE) ×1 IMPLANT
YANKAUER SUCT BULB TIP NO VENT (SUCTIONS) IMPLANT

## 2023-12-28 NOTE — Anesthesia Postprocedure Evaluation (Signed)
 Anesthesia Post Note  Patient: Linda Meyers  Procedure(s) Performed: INSERTION, INTRAMEDULLARY ROD, TIBIA (Right)     Patient location during evaluation: PACU Anesthesia Type: General Level of consciousness: awake and alert Pain management: pain level controlled Vital Signs Assessment: post-procedure vital signs reviewed and stable Respiratory status: spontaneous breathing, nonlabored ventilation and respiratory function stable Cardiovascular status: blood pressure returned to baseline and stable Postop Assessment: no apparent nausea or vomiting Anesthetic complications: no   No notable events documented.  Last Vitals:  Vitals:   12/28/23 1300 12/28/23 1315  BP: 136/77 135/77  Pulse: 80 66  Resp: 13 13  Temp:  (!) 36.4 C  SpO2: 99% 97%    Last Pain:  Vitals:   12/28/23 1315  TempSrc:   PainSc: Asleep                 Butler Levander Pinal

## 2023-12-28 NOTE — H&P (View-Only) (Signed)
 Reason for Consult:Right tib/fib fx Referring Physician: Maximino Sharps Time called: [id number] Time at bedside: 0849   Linda Meyers is an 48 y.o. female.  HPI: Linda Meyers was rollerskating at a rink when her feet got out from under her and she fell. She had immediate right lower leg pain and could not bear weight. She was brought to the ED where x-rays showed a tib/fib fx and orthopedic surgery was consulted. 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  Past Medical History:  Diagnosis Date   Anemia    Anxiety    Arthritis    Depression    Lupus (systemic lupus erythematosus) (HCC) 1999    Past Surgical History:  Procedure Laterality Date   vulva vaporization  10/2015    History reviewed. No pertinent family history.  Social History:  reports that she has never smoked. She has never used smokeless tobacco. She reports current alcohol  use. She reports current drug use. Frequency: 7.00 times per week. Drug: Marijuana.  Allergies:  Allergies  Allergen Reactions   Lortab [Hydrocodone-Acetaminophen] Itching and Nausea And Vomiting    NO OPOID   Percocet [Oxycodone-Acetaminophen] Nausea And Vomiting    NO OPOID   Propoxyphene Nausea And Vomiting    Medications: I have reviewed the patient's current medications.  No results found for this or any previous visit (from the past 48 hours).  DG Ankle Complete Right Result Date: 12/27/2023 CLINICAL DATA:  Right ankle pain after fall while skating. EXAM: RIGHT ANKLE - COMPLETE 3+ VIEW COMPARISON:  02/25/2019 FINDINGS: Displaced and comminuted distal tibial shaft fracture. 5 mm lateral displacement of distal fracture fragment. No convincing intra-articular extension. Oblique displaced distal fibular shaft fracture. There is no mortise widening. No ankle joint effusion. IMPRESSION: 1. Displaced and comminuted distal tibial shaft fracture. 2. Displaced distal fibular shaft fracture. Electronically Signed   By: Andrea Gasman M.D.   On:  12/27/2023 23:30    Review of Systems  HENT:  Negative for ear discharge, ear pain, hearing loss and tinnitus.   Eyes:  Negative for photophobia and pain.  Respiratory:  Negative for cough and shortness of breath.   Cardiovascular:  Negative for chest pain.  Gastrointestinal:  Negative for abdominal pain, nausea and vomiting.  Genitourinary:  Negative for dysuria, flank pain, frequency and urgency.  Musculoskeletal:  Positive for arthralgias (Right lower leg). Negative for back pain, myalgias and neck pain.  Neurological:  Negative for dizziness and headaches.  Hematological:  Does not bruise/bleed easily.  Psychiatric/Behavioral:  The patient is not nervous/anxious.    Blood pressure 139/62, pulse 64, temperature 98.3 F (36.8 C), temperature source Oral, resp. rate 18, height 5' 6 (1.676 m), weight 84.5 kg, SpO2 96%. Physical Exam Constitutional:      General: She is not in acute distress.    Appearance: She is well-developed. She is not diaphoretic.  HENT:     Head: Normocephalic and atraumatic.   Eyes:     General: No scleral icterus.       Right eye: No discharge.        Left eye: No discharge.     Conjunctiva/sclera: Conjunctivae normal.    Cardiovascular:     Rate and Rhythm: Normal rate and regular rhythm.  Pulmonary:     Effort: Pulmonary effort is normal. No respiratory distress.   Musculoskeletal:     Cervical back: Normal range of motion.     Comments: RLE No traumatic wounds, ecchymosis, or rash  Short leg splint in  place  No knee effusion  Knee stable to varus/ valgus and anterior/posterior stress  Sens DPN, SPN, TN intact  Motor EHL 5/5  Toes pefused, No significant edema   Skin:    General: Skin is warm and dry.   Neurological:     Mental Status: She is alert.   Psychiatric:        Mood and Affect: Mood normal.        Behavior: Behavior normal.     Assessment/Plan: Right tib/fib fx -- Plan IMN today with Dr. Kendal. Please keep  NPO.    Linda DOROTHA Ned, PA-C Orthopedic Surgery [phone] 12/28/2023, 9:07 AM

## 2023-12-28 NOTE — Consult Note (Signed)
 Reason for Consult:Right tib/fib fx Referring Physician: Maximino Sharps Time called: [id number] Time at bedside: 0849   Linda Meyers is an 48 y.o. female.  HPI: Chrisy was rollerskating at a rink when her feet got out from under her and she fell. She had immediate right lower leg pain and could not bear weight. She was brought to the ED where x-rays showed a tib/fib fx and orthopedic surgery was consulted. 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  Past Medical History:  Diagnosis Date   Anemia    Anxiety    Arthritis    Depression    Lupus (systemic lupus erythematosus) (HCC) 1999    Past Surgical History:  Procedure Laterality Date   vulva vaporization  10/2015    History reviewed. No pertinent family history.  Social History:  reports that she has never smoked. She has never used smokeless tobacco. She reports current alcohol  use. She reports current drug use. Frequency: 7.00 times per week. Drug: Marijuana.  Allergies:  Allergies  Allergen Reactions   Lortab [Hydrocodone-Acetaminophen] Itching and Nausea And Vomiting    NO OPOID   Percocet [Oxycodone-Acetaminophen] Nausea And Vomiting    NO OPOID   Propoxyphene Nausea And Vomiting    Medications: I have reviewed the patient's current medications.  No results found for this or any previous visit (from the past 48 hours).  DG Ankle Complete Right Result Date: 12/27/2023 CLINICAL DATA:  Right ankle pain after fall while skating. EXAM: RIGHT ANKLE - COMPLETE 3+ VIEW COMPARISON:  02/25/2019 FINDINGS: Displaced and comminuted distal tibial shaft fracture. 5 mm lateral displacement of distal fracture fragment. No convincing intra-articular extension. Oblique displaced distal fibular shaft fracture. There is no mortise widening. No ankle joint effusion. IMPRESSION: 1. Displaced and comminuted distal tibial shaft fracture. 2. Displaced distal fibular shaft fracture. Electronically Signed   By: Andrea Gasman M.D.   On:  12/27/2023 23:30    Review of Systems  HENT:  Negative for ear discharge, ear pain, hearing loss and tinnitus.   Eyes:  Negative for photophobia and pain.  Respiratory:  Negative for cough and shortness of breath.   Cardiovascular:  Negative for chest pain.  Gastrointestinal:  Negative for abdominal pain, nausea and vomiting.  Genitourinary:  Negative for dysuria, flank pain, frequency and urgency.  Musculoskeletal:  Positive for arthralgias (Right lower leg). Negative for back pain, myalgias and neck pain.  Neurological:  Negative for dizziness and headaches.  Hematological:  Does not bruise/bleed easily.  Psychiatric/Behavioral:  The patient is not nervous/anxious.    Blood pressure 139/62, pulse 64, temperature 98.3 F (36.8 C), temperature source Oral, resp. rate 18, height 5' 6 (1.676 m), weight 84.5 kg, SpO2 96%. Physical Exam Constitutional:      General: She is not in acute distress.    Appearance: She is well-developed. She is not diaphoretic.  HENT:     Head: Normocephalic and atraumatic.   Eyes:     General: No scleral icterus.       Right eye: No discharge.        Left eye: No discharge.     Conjunctiva/sclera: Conjunctivae normal.    Cardiovascular:     Rate and Rhythm: Normal rate and regular rhythm.  Pulmonary:     Effort: Pulmonary effort is normal. No respiratory distress.   Musculoskeletal:     Cervical back: Normal range of motion.     Comments: RLE No traumatic wounds, ecchymosis, or rash  Short leg splint in  place  No knee effusion  Knee stable to varus/ valgus and anterior/posterior stress  Sens DPN, SPN, TN intact  Motor EHL 5/5  Toes pefused, No significant edema   Skin:    General: Skin is warm and dry.   Neurological:     Mental Status: She is alert.   Psychiatric:        Mood and Affect: Mood normal.        Behavior: Behavior normal.     Assessment/Plan: Right tib/fib fx -- Plan IMN today with Dr. Kendal. Please keep  NPO.    Ozell DOROTHA Ned, PA-C Orthopedic Surgery [phone] 12/28/2023, 9:07 AM

## 2023-12-28 NOTE — Progress Notes (Signed)
 Ortho Trauma Note  Seen postop, doing well. No complaints. Drowsy. Pain controlled. Reviewed x-rays with her. Plan for PT tomorrow.  Franky MYRTIS Light, MD [facility] [phone] (office) [email or web address]

## 2023-12-28 NOTE — Progress Notes (Signed)
 Patient arrived on unit from [facility]; alert and oriented x 4. No distress noted. Settled into room. Will continue to monitor.

## 2023-12-28 NOTE — Anesthesia Preprocedure Evaluation (Signed)
 Anesthesia Evaluation  Patient identified by MRN, date of birth, ID band Patient awake    Reviewed: Allergy & Precautions, H&P , NPO status , Patient's Chart, lab work & pertinent test results  Airway Mallampati: II       Dental  (+) Teeth Intact   Pulmonary neg pulmonary ROS   breath sounds clear to auscultation       Cardiovascular negative cardio ROS  Rhythm:Regular Rate:Normal     Neuro/Psych  PSYCHIATRIC DISORDERS Anxiety Depression    negative neurological ROS  negative psych ROS   GI/Hepatic negative GI ROS, Neg liver ROS,,,  Endo/Other  negative endocrine ROS    Renal/GU negative Renal ROS  negative genitourinary   Musculoskeletal negative musculoskeletal ROS (+) Arthritis , Osteoarthritis,    Abdominal  (+) + obese  Peds negative pediatric ROS (+)  Hematology negative hematology ROS (+)   Anesthesia Other Findings   Reproductive/Obstetrics negative OB ROS                             Anesthesia Physical Anesthesia Plan  ASA: II  Anesthesia Plan: General   Post-op Pain Management:    Induction: Intravenous  PONV Risk Score and Plan: 3 and Ondansetron , Dexamethasone , Midazolam  and Treatment may vary due to age or medical condition  Airway Management Planned: Oral ETT  Additional Equipment:   Intra-op Plan:   Post-operative Plan: Extubation in OR  Informed Consent: I have reviewed the patients History and Physical, chart, labs and discussed the procedure including the risks, benefits and alternatives for the proposed anesthesia with the patient or authorized representative who has indicated his/her understanding and acceptance.     Dental advisory given  Plan Discussed with: CRNA  Anesthesia Plan Comments: (Lupus (SLE))        Anesthesia Quick Evaluation

## 2023-12-28 NOTE — Transfer of Care (Signed)
 Immediate Anesthesia Transfer of Care Note  Patient: Linda Meyers  Procedure(s) Performed: INSERTION, INTRAMEDULLARY ROD, TIBIA (Right)  Patient Location: PACU  Anesthesia Type:General  Level of Consciousness: awake, alert , and oriented  Airway & Oxygen Therapy: Patient Spontanous Breathing and Patient connected to nasal cannula oxygen  Post-op Assessment: Report given to RN and Post -op Vital signs reviewed and stable  Post vital signs: Reviewed and stable  Last Vitals:  Vitals Value Taken Time  BP 144/79 12/28/23 12:15  Temp    Pulse 100 12/28/23 12:19  Resp 15 12/28/23 12:18  SpO2 97 % 12/28/23 12:19  Vitals shown include unfiled device data.  Last Pain:  Vitals:   12/28/23 0845  TempSrc:   PainSc: 7          Complications: No notable events documented.
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
## 2023-12-28 NOTE — ED Notes (Addendum)
 Patient called out to say her R foot felt numb. +3 R pedal pulses palpated. Patient able to move toes. Toes warm to touch, color appropriate.

## 2023-12-28 NOTE — Op Note (Signed)
 Orthopaedic Surgery Operative Note (CSN: [id number] ) Date of Surgery:12/28/2023  Admit Date: 12/27/2023   Diagnoses: Pre-Op Diagnoses: Right distal tibia/fibula fracture  Post-Op Diagnosis: Same  Procedures: CPT 27759-Intramedullary nailing of right tibia fracture CPT 27781-Nonoperative management of right distal fibula fracture  Surgeons : Primary: Kendal Franky SQUIBB, MD  Assistant: Lauraine Moores, PA-C  Location: OR 3   Anesthesia: General   Antibiotics: Ancef  2g preop with 1 gm vancomycin  powder placed topically   Tourniquet time: None    Estimated Blood Loss: 100 mL  Complications:* No complications entered in OR log *   Specimens:* No specimens in log *   Implants: Implant Name Type Inv. Item Serial No. Manufacturer Lot No. LRB No. Used Action  NAIL TIBIA AFFIX ST 10X360 - [id number] Nail NAIL TIBIA AFFIX ST 10X360  [employer](ORTH,TRAU,BIO,SG) [id number] Right 1 Implanted  SCREW CORT ST AFFIX 5X32 - [id number] Screw SCREW CORT ST AFFIX 5X32  [employer](ORTH,TRAU,BIO,SG) [id number] Right 1 Implanted  SCREW CORT AFFIX ST 5X44 - [id number] Screw SCREW CORT AFFIX ST 5X44  [employer](ORTH,TRAU,BIO,SG) [id number] Right 1 Implanted  SCREW CORT AFFIX ST 5X34 - [id number] Screw SCREW CORT AFFIX ST 5X34  [employer](ORTH,TRAU,BIO,SG) [id number] Right 1 Implanted  SCREW CORT AFFIX ST 5X40 - [id number] Screw SCREW CORT AFFIX ST 5X40  [employer](ORTH,TRAU,BIO,SG) [id number] Right 1 Implanted     Indications for Surgery: 48 year old female who fell while skating sustaining the distal tibia and fibula fracture.  Due to the unstable nature of her injury I recommend proceeding with intramedullary nailing of the right tibia.  Risks and benefits were discussed with the patient.  Risks include but not limited to bleeding, infection, malunion, nonunion, hardware failure, hardware irritation, nerve and blood vessel injury, DVT, even possible anesthetic complications.  She agreed to proceed with  surgery and consent was obtained.  Operative Findings: 1.  Intramedullary nailing of right tibial shaft fracture using [employer] tibial nail 10 x 360 mm nail 2.  External rotation stress view after fixation of the tibia showing no medial clear space widening and nonoperative management of distal fibula fracture.  Procedure: The patient was identified in the preoperative holding area. Consent was confirmed with the patient and their family and all questions were answered. The operative extremity was marked after confirmation with the patient. she was then brought back to the operating room by our anesthesia colleagues.  She was carefully transferred over to radiolucent flattop table.  She was placed under general anesthetic.  A bump was placed under the right lower extremity.  It was prepped and draped in usual sterile fashion.  A timeout was performed to verify the patient, the procedure, and the extremity.  Preoperative antibiotics were dosed.  Fluoroscopic imaging showed the unstable nature of her injury.  A lateral parapatellar incision was made and carried down through skin subcutaneous tissue.  I incised through the retinaculum to mobilize the patella medially.  I then directed the threaded guidewire to the appropriate starting point and advanced it into the proximal metaphysis.  I then used an entry reamer to enter the medullary canal.  I then passed a ball-tipped guidewire down the center of the canal and seated it into the distal metaphysis.  I confirmed adequate alignment and measured the length and chose to use a 360 mm nail.  I then sequentially reamed from 8 mm to 11.5 mm and placed a 10 x 36 mm nail.  Nail was placed and the fracture aligned appropriately.  Using perfect  circle technique I placed 2 medial to lateral distal interlocking screws.  I then used the targeting arm to place 2 proximal interlocking screws.  Final fluoroscopic imaging was obtained.  The incisions were  copiously irrigated.  A gram of vancomycin  powder was placed into the incision.  Layered closure of 0 Vicryl, 2-0 Monocryl and 3-0 Monocryl with Dermabond was used to close the skin.  Sterile dressings were applied.  The patient was then awoke from anesthesia and taken to the PACU in stable condition.  Post Op Plan/Instructions: Patient will be nonweightbearing to the right lower extremity.  She will receive postoperative Ancef .  She will be placed on aspirin for DVT prophylaxis.  Will have her mobilize with physical and Occupational Therapy.  I was present and performed the entire surgery.  Lauraine Moores, PA-C did assist me throughout the case. An assistant was necessary given the difficulty in approach, maintenance of reduction and ability to instrument the fracture.   Franky Light, MD [facility]

## 2023-12-28 NOTE — Interval H&P Note (Signed)
 History and Physical Interval Note:  12/28/2023 9:33 AM  Linda Meyers  has presented today for surgery, with the diagnosis of Right tibia fractre.  The various methods of treatment have been discussed with the patient and family. After consideration of risks, benefits and other options for treatment, the patient has consented to  Procedure(s): INSERTION, INTRAMEDULLARY ROD, TIBIA (Right) as a surgical intervention.  The patient's history has been reviewed, patient examined, no change in status, stable for surgery.  I have reviewed the patient's chart and labs.  Questions were answered to the patient's satisfaction.     Cayle Thunder P Lendon George

## 2023-12-28 NOTE — Progress Notes (Signed)
 Orthopedic Tech Progress Note Patient Details:  Linda Meyers 09-11-1975 [id number]  Ortho Devices Type of Ortho Device: CAM walker Ortho Device/Splint Location: RLE Ortho Device/Splint Interventions: Rosa Morna Pink 12/28/2023, 12:48 PM

## 2023-12-28 NOTE — Anesthesia Procedure Notes (Signed)
 Procedure Name: Intubation Date/Time: 12/28/2023 10:45 AM  Performed by: Vertie Arthea RAMAN, CRNAPre-anesthesia Checklist: Patient identified, Emergency Drugs available, Suction available and Patient being monitored Patient Re-evaluated:Patient Re-evaluated prior to induction Oxygen Delivery Method: Circle System Utilized Preoxygenation: Pre-oxygenation with 100% oxygen Induction Type: IV induction Ventilation: Mask ventilation without difficulty Laryngoscope Size: Miller and 2 Grade View: Grade I Tube type: Oral Tube size: 7.0 mm Number of attempts: 1 Airway Equipment and Method: Stylet and Oral airway Placement Confirmation: ETT inserted through vocal cords under direct vision, positive ETCO2 and breath sounds checked- equal and bilateral Tube secured with: Tape Dental Injury: Teeth and Oropharynx as per pre-operative assessment

## 2023-12-29 ENCOUNTER — Encounter (HOSPITAL_COMMUNITY): Payer: Self-pay | Admitting: Student

## 2023-12-29 DIAGNOSIS — S82301A Unspecified fracture of lower end of right tibia, initial encounter for closed fracture: Secondary | ICD-10-CM | POA: Diagnosis not present

## 2023-12-29 LAB — CBC
HCT: 33.7 % — ABNORMAL LOW (ref 36.0–46.0)
Hemoglobin: 11 g/dL — ABNORMAL LOW (ref 12.0–15.0)
MCH: 28.2 pg (ref 26.0–34.0)
MCHC: 32.6 g/dL (ref 30.0–36.0)
MCV: 86.4 fL (ref 80.0–100.0)
Platelets: 372 10*3/uL (ref 150–400)
RBC: 3.9 MIL/uL (ref 3.87–5.11)
RDW: 15 % (ref 11.5–15.5)
WBC: 9.2 10*3/uL (ref 4.0–10.5)
nRBC: 0 % (ref 0.0–0.2)

## 2023-12-29 LAB — COMPREHENSIVE METABOLIC PANEL WITH GFR
ALT: 14 U/L (ref 0–44)
AST: 20 U/L (ref 15–41)
Albumin: 3.1 g/dL — ABNORMAL LOW (ref 3.5–5.0)
Alkaline Phosphatase: 57 U/L (ref 38–126)
Anion gap: 10 (ref 5–15)
BUN: 8 mg/dL (ref 6–20)
CO2: 20 mmol/L — ABNORMAL LOW (ref 22–32)
Calcium: 8.3 mg/dL — ABNORMAL LOW (ref 8.9–10.3)
Chloride: 107 mmol/L (ref 98–111)
Creatinine, Ser: 0.77 mg/dL (ref 0.44–1.00)
GFR, Estimated: >60 mL/min (ref >60)
Glucose, Bld: 126 mg/dL — ABNORMAL HIGH (ref 70–99)
Potassium: 3.5 mmol/L (ref 3.5–5.1)
Sodium: 137 mmol/L (ref 135–145)
Total Bilirubin: 0.7 mg/dL (ref 0.0–1.2)
Total Protein: 6.7 g/dL (ref 6.5–8.1)

## 2023-12-29 LAB — VITAMIN D 25 HYDROXY (VIT D DEFICIENCY, FRACTURES): Vit D, 25-Hydroxy: 46.5 ng/mL (ref 30–100)

## 2023-12-29 LAB — HCG, SERUM, QUALITATIVE: Preg, Serum: NEGATIVE

## 2023-12-29 LAB — HIV ANTIBODY (ROUTINE TESTING W REFLEX): HIV Screen 4th Generation wRfx: NONREACTIVE

## 2023-12-29 MED ORDER — ENSURE PLUS HIGH PROTEIN PO LIQD
237.0000 mL | Freq: Two times a day (BID) | ORAL | Status: DC
  Administered 2023-12-29 – 2023-12-30 (×2): 237 mL via ORAL

## 2023-12-29 NOTE — Evaluation (Signed)
 Occupational Therapy Evaluation Patient Details Name: Linda Meyers MRN: [id number] DOB: August 19, 1975 Today's Date: 12/29/2023   History of Present Illness   48 y.o. female with medical history significant of lupus, rheumatoid arthritis, anxiety, Raynaud's phenomenon, anemia, and L4-L5 and L5-S1 laminotomies with fusion who present with right ankle pain after a fall while roller skating. Underwent IM nailing R tibia fracture 6/30. Non-op management of R distal fibula fx.     Clinical Impressions Patient is s/p R tibia fracture surgery resulting in functional limitations due to the deficits listed below (see OT Problem List). 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 Patient will benefit from acute skilled OT to increase their safety and independence with ADL and functional mobility for ADL to allow facilitate discharge. Pt would benefit from [facility] at discharge. Recommend PRN assistance from friends/family when discharged to assist with ADL tasks as needed d/t to NWB status of RLE. Pt reports that she has a large walk-in shower that RW can fit into. Discussed safety with walk-in shower transfer. No follow up OT services recommended.       If plan is discharge home, recommend the following:   A little help with bathing/dressing/bathroom;Help with stairs or ramp for entrance;Assist for transportation;Assistance with cooking/housework     Functional Status Assessment   Patient has had a recent decline in their functional status and demonstrates the ability to make significant improvements in function in a reasonable and predictable amount of time.     Equipment Recommendations   BSC/3in1      Precautions/Restrictions   Precautions Precautions: Fall Recall of Precautions/Restrictions: Intact Restrictions Weight Bearing Restrictions Per Provider Order: Yes RLE Weight Bearing Per Provider Order: Non weight  bearing Other Position/Activity Restrictions: CAM boot on when OOB     Mobility Bed Mobility Overal bed mobility: Modified Independent     Transfers Overall transfer level: Needs assistance Equipment used: Rolling walker (2 wheels) Transfers: Sit to/from Stand, Bed to chair/wheelchair/BSC Sit to Stand: Supervision           General transfer comment: Completed hop pivot transfer from bed to recliner utilizing RW while maintaining RLE NWB without physical assist. OT provided verbal instructions and visual demonstration prior to transfer and sit to stand transition for RW management and maintaining weight bearing restrictions.      Balance Overall balance assessment: Needs assistance Sitting-balance support: Feet supported, No upper extremity supported Sitting balance-Leahy Scale: Good Sitting balance - Comments: sitting EOB   Standing balance support: Bilateral upper extremity supported, During functional activity, Reliant on assistive device for balance Standing balance-Leahy Scale: Fair      ADL either performed or assessed with clinical judgement   ADL Overall ADL's : Needs assistance/impaired Eating/Feeding: Independent;Sitting   Grooming: Oral care;Set up;Sitting   Upper Body Bathing: Set up;Sitting   Lower Body Bathing: Minimal assistance;Bed level;Sitting/lateral leans   Upper Body Dressing : Set up;Sitting   Lower Body Dressing: Total assistance;Bed level Lower Body Dressing Details (indicate cue type and reason): total assist to don CAM boot at bed level. Toilet Transfer: Supervision/safety;Ambulation;BSC/3in1;Rolling walker (2 wheels)   Toileting- Clothing Manipulation and Hygiene: Set up;Sitting/lateral lean;Sit to/from stand               Vision Baseline Vision/History: 0 No visual deficits Ability to See in Adequate Light: 0 Adequate Patient Visual Report: No change from baseline Vision Assessment?: No apparent visual deficits     Perception  Perception: Not tested       Praxis Praxis: Not tested       Pertinent Vitals/Pain Pain Assessment Pain Assessment: 0-10 Pain Score: 4  Pain Descriptors / Indicators: Aching, Constant Pain Intervention(s): RN gave pain meds during session, Monitored during session     Extremity/Trunk Assessment Upper Extremity Assessment Upper Extremity Assessment: Right hand dominant;Overall [facility] for tasks assessed   Lower Extremity Assessment Lower Extremity Assessment: Defer to PT evaluation   Cervical / Trunk Assessment Cervical / Trunk Assessment: Normal   Communication Communication Communication: No apparent difficulties   Cognition Arousal: Alert Behavior During Therapy: WFL for tasks assessed/performed Cognition: No apparent impairments     Following commands: Intact       Cueing  General Comments   Cueing Techniques: Verbal cues  VSS on RA           Home Living Family/patient expects to be discharged to:: Private residence Living Arrangements: Alone Available Help at Discharge: Friend(s);Available PRN/intermittently Type of Home: House Home Access: Stairs to enter [employer] of Steps: 2 - front (no rail) 4- back (rail right side) Entrance Stairs-Rails: None Home Layout: One level     Bathroom Shower/Tub: Walk-in shower;Tub/shower unit         Home Equipment: Shower seat - built in;Crutches;Hand held shower head          Prior Functioning/Environment Prior Level of Function : Independent/Modified Independent;Driving;Working/employed  ADLs Comments: Pt works on as Patent attorney.    OT Problem List: Pain;Impaired balance (sitting and/or standing);Decreased knowledge of use of DME or AE   OT Treatment/Interventions: Self-care/ADL training;Therapeutic exercise;Therapeutic activities;DME and/or AE instruction;Patient/family education;Balance training      OT Goals(Current goals can be found in the care plan section)   Acute Rehab  OT Goals Patient Stated Goal: to heal fast OT Goal Formulation: With patient Time For Goal Achievement: 01/12/24 Potential to Achieve Goals: Good   OT Frequency:  Min 2X/week       AM-PAC OT 6 Clicks Daily Activity     Outcome Measure Help from another person eating meals?: None Help from another person taking care of personal grooming?: None Help from another person toileting, which includes using toliet, bedpan, or urinal?: A Little Help from another person bathing (including washing, rinsing, drying)?: A Little Help from another person to put on and taking off regular upper body clothing?: None Help from another person to put on and taking off regular lower body clothing?: A Little 6 Click Score: 21   End of Session Equipment Utilized During Treatment: Gait belt;Rolling walker (2 wheels);Other (comment) (CAM boot) Nurse Communication: Mobility status  Activity Tolerance: Patient tolerated treatment well Patient left: in chair;with call bell/phone within reach;with chair alarm set  OT Visit Diagnosis: Unsteadiness on feet (R26.81);Muscle weakness (generalized) (M62.81);Pain Pain - Right/Left: Right Pain - part of body: Leg                Time: 0827-0905 OT Time Calculation (min): 38 min Charges:  OT General Charges $OT Visit: 1 Visit OT Evaluation $OT Eval Moderate Complexity: 1 Mod OT Treatments $Self Care/Home Management : 8-22 mins  Linda Meyers, OTR/L,CBIS  Supplemental OT - [facility] and [facility] Secure Chat Preferred    Melton Walls, Linda BIRCH 12/29/2023, 9:18 AM

## 2023-12-29 NOTE — TOC CAGE-AID Note (Signed)
 Transition of Care [facility]) - CAGE-AID Screening   Patient Details  Name: Linda Meyers MRN: [id number] Date of Birth: 12/19/1975  [facility] of [facility]) CM/SW Contact:    Stacy Sailer E Ravindra Baranek, LCSW Phone Number: 12/29/2023, 10:21 AM   Clinical Narrative: Patient states she drinks alcohol  occasionally. Patient denies other substance use. Patient denies SA resource needs.   CAGE-AID Screening:    Have You Ever Felt You Ought to Cut Down on Your Drinking or Drug Use?: No Have People Annoyed You By Critizing Your Drinking Or Drug Use?: No Have You Felt Bad Or Guilty About Your Drinking Or Drug Use?: No Have You Ever Had a Drink or Used Drugs First Thing In The Morning to Steady Your Nerves or to Get Rid of a Hangover?: No CAGE-AID Score: 0  Substance Abuse Education Offered: Yes

## 2023-12-29 NOTE — Evaluation (Signed)
 Physical Therapy Evaluation Patient Details Name: Linda Meyers MRN: [id number] DOB: [birth year] Today's Date: 12/29/2023  History of Present Illness  48 y.o. female with medical history significant of lupus, rheumatoid arthritis, anxiety, Raynaud's phenomenon, anemia, and L4-L5 and L5-S1 laminotomies with fusion who present with right ankle pain after a fall while roller skating. Underwent IM nailing R tibia fracture 6/30. Non-op management of R distal fibula fx.  Clinical Impression  Pt admitted with above diagnosis. Pt from home alone but has good support system. 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 Recommend RW for home for increased stability and energy conservation compared to crutches. Pt doing well with NWB hopping but had more difficulty with stairs. Needs to practice again before d/c home. Pt given exercise handout for LE strengthening while NWB. Recommend outpt PT when cleared by physician.  Pt currently with functional limitations due to the deficits listed below (see PT Problem List). Pt will benefit from acute skilled PT to increase their independence and safety with mobility to allow discharge.           If plan is discharge home, recommend the following: Assist for transportation;Help with stairs or ramp for entrance;Assistance with cooking/housework   Can travel by private vehicle        Equipment Recommendations Rolling walker (2 wheels)  Recommendations for Other Services       Functional Status Assessment Patient has had a recent decline in their functional status and demonstrates the ability to make significant improvements in function in a reasonable and predictable amount of time.     Precautions / Restrictions Precautions Precautions: Fall Recall of Precautions/Restrictions: Intact Required Braces or Orthoses: Splint/Cast Splint/Cast: R CAM boot Restrictions Weight Bearing Restrictions Per Provider Order: Yes RLE Weight Bearing Per Provider  Order: Non weight bearing Other Position/Activity Restrictions: CAM boot on when OOB      Mobility  Bed Mobility Overal bed mobility: Modified Independent                  Transfers Overall transfer level: Needs assistance Equipment used: Rolling walker (2 wheels) Transfers: Sit to/from Stand, Bed to chair/wheelchair/BSC Sit to Stand: Supervision Stand pivot transfers: Supervision         General transfer comment: vc's for hand placement    Ambulation/Gait Ambulation/Gait assistance: Supervision Gait Distance (Feet): 20 Feet Assistive device: Rolling walker (2 wheels) Gait Pattern/deviations: Step-to pattern Gait velocity: decreased Gait velocity interpretation: 1.31 - 2.62 ft/sec, indicative of limited community ambulator   General Gait Details: donned L shoe for cushion and height and instructed to use L shoe at home consistently. Hop to pattern with pt able to keep NWB RLE. Practiced turning and backing up with RW. Rec RW over crutches for safety and energy conservation within home  Stairs Stairs: Yes Stairs assistance: Min assist Stair Management: One rail Right, Step to pattern, Sideways Number of Stairs: 3 General stair comments: pt feeling groggy from pain meds which was affecting her ability to grasp how to do the stairs. Could use practice again before d/c  Wheelchair Mobility     Tilt Bed    Modified Rankin (Stroke Patients Only)       Balance Overall balance assessment: Needs assistance Sitting-balance support: Feet supported, No upper extremity supported Sitting balance-Leahy Scale: Good Sitting balance - Comments: sitting EOB   Standing balance support: Bilateral upper extremity supported, During functional activity, Reliant on assistive device for balance Standing balance-Leahy Scale: Fair Standing balance comment: can safely stand  with unilateral UE support with RLE NWB                             Pertinent Vitals/Pain  Pain Assessment Pain Assessment: Faces Faces Pain Scale: Hurts little more Pain Location: R LE Pain Descriptors / Indicators: Aching, Constant Pain Intervention(s): Limited activity within patient's tolerance, Monitored during session, Premedicated before session    Home Living Family/patient expects to be discharged to:: Private residence Living Arrangements: Alone Available Help at Discharge: Friend(s);Available PRN/intermittently Type of Home: House Home Access: Stairs to enter Entrance Stairs-Rails: None Entrance Stairs-Number of Steps: 2 - front (no rail) 4- back (rail right side)   Home Layout: One level Home Equipment: Shower seat - built in;Crutches;Hand held shower head      Prior Function Prior Level of Function : Independent/Modified Independent;Driving;Working/employed             Mobility Comments: independent ADLs Comments: Pt works on as Patent attorney.     Extremity/Trunk Assessment   Upper Extremity Assessment Upper Extremity Assessment: Overall WFL for tasks assessed    Lower Extremity Assessment Lower Extremity Assessment: RLE deficits/detail RLE Deficits / Details: hip WFL, tightness R knee with flexion limited to 80 deg, extension 0 deg. Ankle casted RLE: Unable to fully assess due to immobilization RLE Sensation: WNL RLE Coordination: decreased gross motor    Cervical / Trunk Assessment Cervical / Trunk Assessment: Normal  Communication   Communication Communication: No apparent difficulties    Cognition Arousal: Alert Behavior During Therapy: WFL for tasks assessed/performed   PT - Cognitive impairments: No apparent impairments                         Following commands: Intact       Cueing Cueing Techniques: Verbal cues     General Comments General comments (skin integrity, edema, etc.): discussed home activity modifications and recommendation for her to have a friend stay with her first night home. Pt given  handout for LE strengthening exercises    Exercises General Exercises - Lower Extremity Long Arc Quad: AROM, Right, 10 reps, Seated Heel Slides: AROM, Right, 10 reps, Seated Straight Leg Raises: AROM, Right, 5 reps, Seated   Assessment/Plan    PT Assessment Patient needs continued PT services  PT Problem List Decreased mobility;Decreased activity tolerance;Decreased knowledge of use of DME;Decreased safety awareness;Decreased knowledge of precautions;Pain       PT Treatment Interventions DME instruction;Gait training;Stair training;Functional mobility training;Therapeutic activities;Therapeutic exercise;Patient/family education    PT Goals (Current goals can be found in the Care Plan section)  Acute Rehab PT Goals Patient Stated Goal: return home PT Goal Formulation: With patient Time For Goal Achievement: 01/05/24 Potential to Achieve Goals: Good    Frequency Min 3X/week     Co-evaluation               AM-PAC PT 6 Clicks Mobility  Outcome Measure Help needed turning from your back to your side while in a flat bed without using bedrails?: None Help needed moving from lying on your back to sitting on the side of a flat bed without using bedrails?: None Help needed moving to and from a bed to a chair (including a wheelchair)?: None Help needed standing up from a chair using your arms (e.g., wheelchair or bedside chair)?: A Little Help needed to walk in hospital room?: A Little Help needed climbing 3-5 steps with a  railing? : A Lot 6 Click Score: 20    End of Session   Activity Tolerance: Patient tolerated treatment well Patient left: in bed;with call bell/phone within reach Nurse Communication: Mobility status PT Visit Diagnosis: Pain;Difficulty in walking, not elsewhere classified (R26.2) Pain - Right/Left: Right Pain - part of body: Ankle and joints of foot    Time: 0922-1001 PT Time Calculation (min) (ACUTE ONLY): 39 min   Charges:   PT Evaluation $PT  Eval Moderate Complexity: 1 Mod PT Treatments $Gait Training: 8-22 mins $Therapeutic Exercise: 8-22 mins PT General Charges $$ ACUTE PT VISIT: 1 Visit         Richerd Lipoma, PT  Acute Rehab Services Secure chat preferred Office [phone]   Richerd CROME Astraea Gaughran 12/29/2023, 10:23 AM

## 2023-12-29 NOTE — Progress Notes (Signed)
 Orthopaedic Trauma Progress Note  SUBJECTIVE: Doing ok this AM. Notes some pain in the lateral lower leg, asking for some medication now. Has been transferring fairly well to bedside commode. No chest pain. No SOB. No nausea/vomiting. No other complaints. Tolerating diet and fluids. Patient lives at home alone with her dog. Thinks she may have a friend/family member who could stay with her or at least stop by and help throughout the day at discharge.   OBJECTIVE:  Vitals:   12/29/23 0024 12/29/23 0419  BP: 118/73 121/60  Pulse: 86 73  Resp: 17 18  Temp: 97.8 F (36.6 C) 98.2 F (36.8 C)  SpO2: 98% 96%    Opiates Today (MME): Today's  total administered Morphine Milligram Equivalents: 7.5 Opiates Yesterday (MME): Yesterday's total administered Morphine Milligram Equivalents: 185  General: Sitting up in bed, NAD Respiratory: No increased work of breathing.  Operative Extremity (RLE): Dressing CDI. Soreness with palpation through lower leg. Ankle DF/PF intact. Endorses sensation to light touch over all aspects of the foot. +EHL/FHL. +DP pulse  IMAGING: Stable post op imaging.   LABS:  Results for orders placed or performed during the hospital encounter of 12/27/23 (from the past 24 hours)  hCG, serum, qualitative     Status: None   Collection Time: 12/29/23  6:07 AM  Result Value Ref Range   Preg, Serum NEGATIVE NEGATIVE  CBC     Status: Abnormal   Collection Time: 12/29/23  6:07 AM  Result Value Ref Range   WBC 9.2 4.0 - 10.5 K/uL   RBC 3.90 3.87 - 5.11 MIL/uL   Hemoglobin 11.0 (L) 12.0 - 15.0 g/dL   HCT 66.2 (L) 63.9 - 53.9 %   MCV 86.4 80.0 - 100.0 fL   MCH 28.2 26.0 - 34.0 pg   MCHC 32.6 30.0 - 36.0 g/dL   RDW 84.9 88.4 - 84.4 %   Platelets 372 150 - 400 K/uL   nRBC 0.0 0.0 - 0.2 %  Comprehensive metabolic panel     Status: Abnormal   Collection Time: 12/29/23  6:07 AM  Result Value Ref Range   Sodium 137 135 - 145 mmol/L   Potassium 3.5 3.5 - 5.1 mmol/L   Chloride  107 98 - 111 mmol/L   CO2 20 (L) 22 - 32 mmol/L   Glucose, Bld 126 (H) 70 - 99 mg/dL   BUN 8 6 - 20 mg/dL   Creatinine, Ser 9.22 0.44 - 1.00 mg/dL   Calcium 8.3 (L) 8.9 - 10.3 mg/dL   Total Protein 6.7 6.5 - 8.1 g/dL   Albumin 3.1 (L) 3.5 - 5.0 g/dL   AST 20 15 - 41 U/L   ALT 14 0 - 44 U/L   Alkaline Phosphatase 57 38 - 126 U/L   Total Bilirubin 0.7 0.0 - 1.2 mg/dL   GFR, Estimated >39 >39 mL/min   Anion gap 10 5 - 15    ASSESSMENT: Linda Meyers is a 48 y.o. female, 1 Day Post-Op s/p fall while rollerskating Procedures: INTRAMEDULLARY NAIL RIGHT TIBIA  CV/Blood loss: Acute blood loss anemia, Hgb 11.0 this AM. Hemodynamically stable  PLAN: Weightbearing: NWB RLE ROM: Okay for knee and ankle motion as tolerated Incisional and dressing care: Reinforce dressings as needed  Showering: Okay to begin getting incisions wet starting 12/31/2023 Orthopedic device(s): CAM boot RLE when OOB Pain management:  1. Tylenol 1000 mg q 6 hours scheduled 2. Robaxin 500 mg q 6 hours PRN 3. Oxycodone 5-10 mg q 4 hours  PRN 4. Dilaudid  0.5 mg q 3 hours PRN 5. Celebrex 200 mg twice daily VTE prophylaxis: Aspirin, SCDs ID:  Ancef  2gm post op Foley/Lines:  No foley, KVO IVFs Impediments to Fracture Healing: Vitamin D  level pending, will start supplementation as indicated Dispo: PT/OT evaluation today.  Will plan to remove dressing from RLE tomorrow 12/30/2023.  I would anticipate patient will be ready for discharge home tomorrow 12/30/2023 once cleared by medicine team and therapies  D/C recommendations: - Oxycodone, Robaxin, Tylenol for pain control - Aspirin 325 mg daily x 30 days for DVT prophylaxis - Possible need for Vit D supplementation  Follow - up plan: 2 weeks after d/c for wound check and repeat x-rays   Contact information:  Franky Light MD, Lauraine Moores PA-C. After hours and holidays please check [email or web address] for group call information for [facility]   Lauraine PATRIC Moores, PA-C [phone] (office) [email or web address]

## 2023-12-29 NOTE — Progress Notes (Signed)
 Initial Nutrition Assessment  DOCUMENTATION CODES:   Not applicable  INTERVENTION:  Encouraged pt to discuss nausea with RN since pt has prn nausea medications listed  Ensure Plus High Protein po BID, each supplement provides 350 kcal and 20 grams of protein.  Encouraged adequate intake that helps pt meet increased calorie and protein needs while recovering from surgery up to 6 weeks post op  Added nutrition discharge instructions to AVS  Continue daily supplementation of vitamin D3  NUTRITION DIAGNOSIS:   Increased nutrient needs related to post-op healing as evidenced by estimated needs.  GOAL:   Patient will meet greater than or equal to 90% of their needs  MONITOR:   PO intake, Supplement acceptance  REASON FOR ASSESSMENT:   Consult Assessment of nutrition requirement/status  ASSESSMENT:   Pt with hx of lupus, rheumatoid arthritis, anemia, and depression/anxiety. Pt admitted with R ankle pain after falling, diagnosed with R distal tibia/fibula fx.  6/30 Intramedullary nailing of R tibia  Pt underwent surgery for tibia fix but will have nonoperative management of fibula fx. Spoke with pt who was awake and alert. Pt reports nausea since surgery yesterday, discussed asking RN for prn medications to alleviate symptoms. Pt reports eating 1 french toast and a few pieces of bacon for breakfast due to nausea. Diet summary documentation shows 25% intake of dinner yesterday 6/30. Discussed importance of adequate intake to help aid in recovery from surgery. Discussed increased calorie and protein needs post op, pt understands. Pt reports she hasn't had bowel movement in 3 days which is also impacting appetite, discussed trying prn miralax to help.   PTA, pt reports eating 3x per day. Pt would usually make a smoothie for breakfast w/ added protein powder. For lunch, pt would eat a salad or leftovers from dinner then for dinner pt would typically make a protein with vegetable and  side. Pt reports typically eating well rounded and reports no nutrition concerns leading up to admission. Pt reports she recently started taking a semaglutide injection within the last month, but has not consistently taken the injection every week. Discussed importance of protein intake at every meal to help preserve muscle stores and prevent depletions which tends to happen when taking a semaglutide regularly. Discussed that nausea is also side effect of drug. Pt reports she has not noticed any wt loss since beginning the prescription and nutrition focused physical exam showed only mild depletion of the temple region.   Medications reviewed and include:  Vitamin D3 Colace Cefazolin   Miralax Zofran    Labs reviewed   NUTRITION - FOCUSED PHYSICAL EXAM:  Flowsheet Row Most Recent Value  Orbital Region No depletion  Upper Arm Region No depletion  Thoracic and Lumbar Region No depletion  Buccal Region No depletion  Temple Region Mild depletion  Clavicle Bone Region No depletion  Clavicle and Acromion Bone Region No depletion  Scapular Bone Region No depletion  Dorsal Hand No depletion  Patellar Region No depletion  Anterior Thigh Region No depletion  Posterior Calf Region No depletion  [R leg wrapped]  Edema (RD Assessment) None  Hair Reviewed  Eyes Reviewed  Mouth Reviewed  Skin Reviewed  Nails Reviewed    Diet Order:   Diet Order             Diet regular Room service appropriate? Yes; Fluid consistency: Thin  Diet effective now                   EDUCATION NEEDS:  Education needs have been addressed  Skin:  Skin Assessment: Skin Integrity Issues: Skin Integrity Issues:: Incisions Incisions: R leg  Last BM:  6/28  Height:   Ht Readings from Last 1 Encounters:  12/28/23 5' 6 (1.676 m)    Weight:   Wt Readings from Last 1 Encounters:  12/28/23 84.5 kg    Ideal Body Weight:  59 kg  BMI:  Body mass index is 30.07 kg/m.  Estimated Nutritional Needs:    Kcal:  1900-2100  Protein:  100-115g  Fluid:  >/=2L   Josette Glance, MS, RDN, LDN Clinical Dietitian I Please reach out via secure chat

## 2023-12-29 NOTE — Progress Notes (Signed)
  Progress Note   Patient: Linda Meyers FMW:[id number] DOB: 1976-04-30 DOA: 12/27/2023     1 DOS: the patient was seen and examined on 12/29/2023   Assessment and Plan: R tibia and fibula fracture due to fall - s/p orthopedic surgical intervention  - Tylenol 1000 mg PO q6 hr  - ASA 325 mg PO daily  - Celebrex 200 mg PO bid  - Vit D3 1000 units PO at bedtime  - Docusate 100 mg PO bid  - IV dilaudid  0.5 mg q3 hr PRN  - Robaxin PRN  - Oxycodone PRN  - Orthopedic service is planning to remove the dressing from RLE on 12/30/2023; then pt can DC on 12/30/2023 - PT/OT working with the pt   Systemic lupus - Not on any medication    Acne  - Aldactone 100 mg PO daily   Subjective: Pt seen and examined at the bedside. Physical therapy was in the room working with the pt this morning. Ortho planning dressing change on 12/30/2023. After the dressing change she can likely be discharged home on 12/30/2023.  Physical Exam: Vitals:   12/28/23 2042 12/29/23 0024 12/29/23 0419 12/29/23 0934  BP: (!) 140/73 118/73 121/60 112/67  Pulse: 84 86 73 71  Resp: 17 17 18 18   Temp: 98.2 F (36.8 C) 97.8 F (36.6 C) 98.2 F (36.8 C) 98 F (36.7 C)  TempSrc:  Oral    SpO2: 100% 98% 96% 99%  Weight:      Height:       Physical Exam HENT:     Head: Normocephalic.     Mouth/Throat:     Mouth: Mucous membranes are moist.   Cardiovascular:     Rate and Rhythm: Normal rate.  Pulmonary:     Effort: Pulmonary effort is normal.  Abdominal:     Palpations: Abdomen is soft.   Musculoskeletal:     Comments: R leg in brace and wrapped    Skin:    General: Skin is warm.   Neurological:     Mental Status: She is alert. Mental status is at baseline.   Psychiatric:        Mood and Affect: Mood normal.    Disposition: Status is: Inpatient Remains inpatient appropriate because: Dressing change on 12/30/2023 and ongoing physical therapy needs   Planned Discharge Destination: Home    Time spent: 35  minutes  Author: Umer Harig , MD 12/29/2023 10:02 AM  For on call review [email or web address].

## 2023-12-30 ENCOUNTER — Other Ambulatory Visit (HOSPITAL_COMMUNITY): Payer: Self-pay

## 2023-12-30 DIAGNOSIS — W19XXXA Unspecified fall, initial encounter: Secondary | ICD-10-CM | POA: Diagnosis not present

## 2023-12-30 DIAGNOSIS — F419 Anxiety disorder, unspecified: Secondary | ICD-10-CM | POA: Diagnosis not present

## 2023-12-30 DIAGNOSIS — S82301A Unspecified fracture of lower end of right tibia, initial encounter for closed fracture: Secondary | ICD-10-CM | POA: Diagnosis not present

## 2023-12-30 DIAGNOSIS — M329 Systemic lupus erythematosus, unspecified: Secondary | ICD-10-CM | POA: Diagnosis not present

## 2023-12-30 LAB — CBC
HCT: 33.8 % — ABNORMAL LOW (ref 36.0–46.0)
Hemoglobin: 11 g/dL — ABNORMAL LOW (ref 12.0–15.0)
MCH: 28.2 pg (ref 26.0–34.0)
MCHC: 32.5 g/dL (ref 30.0–36.0)
MCV: 86.7 fL (ref 80.0–100.0)
Platelets: 370 10*3/uL (ref 150–400)
RBC: 3.9 MIL/uL (ref 3.87–5.11)
RDW: 15.1 % (ref 11.5–15.5)
WBC: 8.3 10*3/uL (ref 4.0–10.5)
nRBC: 0 % (ref 0.0–0.2)

## 2023-12-30 MED ORDER — ASPIRIN 325 MG PO TABS
325.0000 mg | ORAL_TABLET | Freq: Every day | ORAL | 0 refills | Status: AC
  Filled 2023-12-30: qty 30, 30d supply, fill #0

## 2023-12-30 MED ORDER — OXYCODONE HCL 5 MG PO TABS
5.0000 mg | ORAL_TABLET | ORAL | 0 refills | Status: AC | PRN
  Filled 2023-12-30: qty 42, 7d supply, fill #0

## 2023-12-30 MED ORDER — POLYETHYLENE GLYCOL 3350 17 GM/SCOOP PO POWD
17.0000 g | Freq: Every day | ORAL | 0 refills | Status: AC | PRN
  Filled 2023-12-30: qty 238, 14d supply, fill #0

## 2023-12-30 MED ORDER — TIZANIDINE HCL 4 MG PO TABS
4.0000 mg | ORAL_TABLET | Freq: Three times a day (TID) | ORAL | 1 refills | Status: AC | PRN
  Filled 2023-12-30: qty 30, 10d supply, fill #0

## 2023-12-30 NOTE — Progress Notes (Signed)
 Orthopaedic Trauma Progress Note  SUBJECTIVE: Doing fairly well today, but having a hard time getting rest.  Pain has been fairly well-controlled.  States therapies went well yesterday.  No chest pain. No SOB. No nausea/vomiting. No other complaints. Tolerating diet and fluids.  Had BM last night, feels that she needs to have another one now.  OBJECTIVE:  Vitals:   12/29/23 1953 12/30/23 0758  BP: 110/68 103/67  Pulse: 72 78  Resp: 16 16  Temp:  98.7 F (37.1 C)  SpO2: 100% 99%    Opiates Today (MME): Today's  total administered Morphine Milligram Equivalents: 0 Opiates Yesterday (MME): Yesterday's total administered Morphine Milligram Equivalents: 22.5  General: Sitting up in bed, NAD Respiratory: No increased work of breathing.  Operative Extremity (RLE): Dressing removed, incisions are CDI. Soreness with palpation through lower leg. Ankle DF/PF intact. Endorses sensation to light touch over all aspects of the foot. +EHL/FHL. +DP pulse  IMAGING: Stable post op imaging.   LABS:  Results for orders placed or performed during the hospital encounter of 12/27/23 (from the past 24 hours)  VITAMIN D  25 Hydroxy (Vit-D Deficiency, Fractures)     Status: None   Collection Time: 12/29/23  8:46 AM  Result Value Ref Range   Vit D, 25-Hydroxy 46.50 30 - 100 ng/mL  CBC     Status: Abnormal   Collection Time: 12/30/23  5:36 AM  Result Value Ref Range   WBC 8.3 4.0 - 10.5 K/uL   RBC 3.90 3.87 - 5.11 MIL/uL   Hemoglobin 11.0 (L) 12.0 - 15.0 g/dL   HCT 66.1 (L) 63.9 - 53.9 %   MCV 86.7 80.0 - 100.0 fL   MCH 28.2 26.0 - 34.0 pg   MCHC 32.5 30.0 - 36.0 g/dL   RDW 84.8 88.4 - 84.4 %   Platelets 370 150 - 400 K/uL   nRBC 0.0 0.0 - 0.2 %    ASSESSMENT: Linda Meyers is a 48 y.o. female, 2 Days Post-Op s/p fall while rollerskating Procedures: INTRAMEDULLARY NAIL RIGHT TIBIA  CV/Blood loss: Acute blood loss anemia, Hgb 11.0 this AM.  Stable.  Hemodynamically stable  PLAN: Weightbearing:  NWB RLE ROM: Okay for knee and ankle motion as tolerated Incisional and dressing care: Okay to leave incisions open to air.  Change dressing as needed. Showering: Okay to begin getting incisions wet starting 12/31/2023 Orthopedic device(s): CAM boot RLE when OOB Pain management:  1. Tylenol 1000 mg q 6 hours scheduled 2. Robaxin 500 mg q 6 hours PRN 3. Oxycodone 5-10 mg q 4 hours PRN 4. Dilaudid  0.5 mg q 3 hours PRN 5. Celebrex 200 mg twice daily VTE prophylaxis: Aspirin, SCDs ID:  Ancef  2gm post op completed Foley/Lines:  No foley, KVO IVFs Impediments to Fracture Healing: Vitamin D  level 46, continue home dose supplementation Dispo: PT/OT evaluation ongoing. Stable for d/c from ortho standpoint  D/C recommendations: - Oxycodone, Robaxin, Tylenol for pain control - Aspirin 325 mg daily x 30 days for DVT prophylaxis - Possible need for Vit D supplementation  Follow - up plan: 2 weeks after d/c for wound check and repeat x-rays   Contact information:  Franky Light MD, Lauraine Moores PA-C. After hours and holidays please check [email or web address] for group call information for [facility]   Lauraine PATRIC Moores, PA-C [phone] (office) [email or web address]

## 2023-12-30 NOTE — Progress Notes (Signed)
 Explained discharge instructions to patient. Reviewed follow up appointment and next medication administration times. Also reviewed education. Patient verbalized having an understanding for instructions given. All belongings are in the patient's possession to include TOC meds. No other needs verbalized. Patient insist on waiting in her room for her ride instead of going to the discharge lounge due to mobility restrictions. Patient's RN made aware.

## 2023-12-30 NOTE — Discharge Summary (Signed)
 Physician Discharge Summary   Patient: Linda Meyers MRN: [id number] DOB: 10-24-1975  Admit date:     12/27/2023  Discharge date: 12/30/23  Discharge Physician: Amaryllis Dare   PCP: Haze Kingfisher, MD   Recommendations at discharge:  Follow-up: CBC and BMP on follow-up Follow-up with primary care provider Follow-up with orthopedic surgery  Discharge Diagnoses: Principal Problem:   Tibial fracture Active Problems:   Fibula fracture   Fall   Anxiety   Lupus (systemic lupus erythematosus) (HCC)   Acne  Resolved Problems:   * No resolved hospital problems. *  Hospital Course: Linda Meyers is a 48 y.o. female with medical history significant of lupus, rheumatoid arthritis, anxiety, and anemia who present with right ankle pain after a fall while roller skating.   On presentation she was hemodynamically stable.  Imaging revealed a displaced and communicated distal tibial shaft fracture and displaced distal fibular shaft fracture.   Orthopedic surgery was consulted and patient was taken to the OR s/p ORIF.  Patient tolerated the procedure well and remained stable afterwards.  Dressing was removed the next day.  Patient will be nonweightbearing for at least 4 weeks and then orthopedic surgery will decide as outpatient.  She was given full dose aspirin as DVT prophylaxis.  She was also provided with oxycodone and tizanidine to use as needed.  Our physical therapist evaluated her and recommended outpatient physical therapy.  Patient was still having right lower leg edema which was anticipated.  Pain seems well-controlled.  Patient will continue with her home medications but she was doing before and follow-up with her providers for further assistance.      Pain control - [place]  Controlled Substance Reporting System database was reviewed. and patient was instructed, not to drive, operate heavy machinery, perform activities at heights, swimming or participation in water  activities or provide baby-sitting services while on Pain, Sleep and Anxiety Medications; until their outpatient Physician has advised to do so again. Also recommended to not to take more than prescribed Pain, Sleep and Anxiety Medications.  Consultants: Orthopedic surgery Procedures performed: ORIF Disposition: Home Diet recommendation:  Discharge Diet Orders (From admission, onward)     Start     Ordered   12/30/23 0000  Diet - low sodium heart healthy        12/30/23 1122           Regular diet DISCHARGE MEDICATION: Allergies as of 12/30/2023       Reactions   Morphine Itching, Nausea And Vomiting   Patient prefers no opioid medication   Propoxyphene Nausea And Vomiting        Medication List     STOP taking these medications    fluconazole  150 MG tablet Commonly known as: DIFLUCAN        TAKE these medications    ascorbic acid  500 MG tablet Commonly known as: VITAMIN C  Take 500 mg by mouth every evening.   aspirin 325 MG tablet Take 1 tablet (325 mg total) by mouth daily.   busPIRone 7.5 MG tablet Commonly known as: BUSPAR Take 7.5 mg by mouth 2 (two) times daily.   cholecalciferol  1000 units tablet Commonly known as: VITAMIN D  Take 1,000 Units by mouth every evening.   diphenhydramine-acetaminophen 25-500 MG Tabs tablet Commonly known as: TYLENOL PM Take 1 tablet by mouth at bedtime as needed (for pain).   imiquimod  5 % cream Commonly known as: ALDARA  Apply 1 Application topically 3 (three) times a week. Every mon, wed, and fri  Linzess 72 MCG capsule Generic drug: linaclotide Take 72 mcg by mouth daily as needed (constipation).   oxyCODONE 5 MG immediate release tablet Commonly known as: Oxy IR/ROXICODONE Take 1 tablet (5 mg total) by mouth every 4 (four) hours as needed for severe pain (pain score 7-10).   polyethylene glycol 17 g packet Commonly known as: MIRALAX / GLYCOLAX Take 17 g by mouth daily as needed for mild constipation.    spironolactone 100 MG tablet Commonly known as: ALDACTONE Take 100 mg by mouth daily.   tiZANidine 4 MG tablet Commonly known as: Zanaflex Take 1 tablet (4 mg total) by mouth every 8 (eight) hours as needed for muscle spasms.   Valtrex 1 g tablet Generic drug: valACYclovir Take 1 g by mouth daily as needed (for outbreaks).   vitamin A  8000 UNIT capsule Take 8,000 Units by mouth every evening.               Durable Medical Equipment  (From admission, onward)           Start     Ordered   12/29/23 1342  For home use only DME Bedside commode  Once       Question Answer Comment  Patient needs a bedside commode to treat with the following condition Weakness   Patient needs a bedside commode to treat with the following condition Closed right tibial fracture      12/29/23 1344   12/29/23 1341  For home use only DME Walker rolling  Once       Question Answer Comment  Walker: With 5 Inch Wheels   Patient needs a walker to treat with the following condition Right tibial fracture   Patient needs a walker to treat with the following condition Weakness      12/29/23 1340              Discharge Care Instructions  (From admission, onward)           Start     Ordered   12/30/23 0000  Leave dressing on - Keep it clean, dry, and intact until clinic visit        12/30/23 1122            Follow-up Information     Swinteck, Redell, MD. Schedule an appointment as soon as possible for a visit .   Specialty: Orthopedic Surgery Contact information: [address] [place] [address] [place] [address] [phone]         Haze Kingfisher, MD Follow up.   Specialty: Family Medicine Contact information: [address] [place] [address] [phone]                Discharge Exam: Linda Meyers   12/28/23 0530 12/28/23 0838  Weight: 84.5 kg 84.5 kg   General.  Well-developed lady, in no acute distress. Pulmonary.  Lungs clear  bilaterally, normal respiratory effort. CV.  Regular rate and rhythm, no JVD, rub or murmur. Abdomen.  Soft, nontender, nondistended, BS positive. CNS.  Alert and oriented .  No focal neurologic deficit. Extremities.  Right lower extremity edema, strip bandage on right knee.  Pulses intact Psychiatry.  Judgment and insight appears normal.   Condition at discharge: stable  The results of significant diagnostics from this hospitalization (including imaging, microbiology, ancillary and laboratory) are listed below for reference.   Imaging Studies: DG Tibia/Fibula Right Port Result Date: 12/28/2023 CLINICAL DATA:  Fracture, postop. EXAM: PORTABLE RIGHT TIBIA AND FIBULA - 2 VIEW COMPARISON:  Preoperative imaging. FINDINGS: Tibial intramedullary nail with proximal and distal locking screw fixation traverse distal tibial fracture. Improved fracture alignment from preoperative imaging. Improved alignment of distal fibular fracture. There is also a nondisplaced fracture of the proximal fibular shaft/neck. Recent postsurgical change includes air and edema in the soft tissues. IMPRESSION: 1. ORIF of distal tibial fracture with improved fracture alignment. 2. Improved alignment of distal fibular fracture. 3. Nondisplaced proximal fibular fracture. Electronically Signed   By: Andrea Gasman M.D.   On: 12/28/2023 13:48   DG Tibia/Fibula Right Result Date: 12/28/2023 CLINICAL DATA:  [id number] Surgery, elective [id number] EXAM: RIGHT TIBIA AND FIBULA - 2 VIEW COMPARISON:  Radiograph yesterday. FINDINGS: Four fluoroscopic spot views of the tibia and fibula submitted from the operating room. Tibial intramedullary nail with proximal and distal locking screw fixation of comminuted distal tibial fracture. Fibular fracture is faintly visualized. Fluoroscopy time 1 minute 19.9 seconds. Dose 2.4 mGy. IMPRESSION: Intraoperative fluoroscopy during tibial fracture fixation. Electronically Signed   By: Andrea Gasman M.D.   On:  12/28/2023 13:46   DG C-Arm 1-60 Min-No Report Result Date: 12/28/2023 Fluoroscopy was utilized by the requesting physician.  No radiographic interpretation.   DG C-Arm 1-60 Min-No Report Result Date: 12/28/2023 Fluoroscopy was utilized by the requesting physician.  No radiographic interpretation.   DG Ankle Complete Right Result Date: 12/27/2023 CLINICAL DATA:  Right ankle pain after fall while skating. EXAM: RIGHT ANKLE - COMPLETE 3+ VIEW COMPARISON:  02/25/2019 FINDINGS: Displaced and comminuted distal tibial shaft fracture. 5 mm lateral displacement of distal fracture fragment. No convincing intra-articular extension. Oblique displaced distal fibular shaft fracture. There is no mortise widening. No ankle joint effusion. IMPRESSION: 1. Displaced and comminuted distal tibial shaft fracture. 2. Displaced distal fibular shaft fracture. Electronically Signed   By: Andrea Gasman M.D.   On: 12/27/2023 23:30    Microbiology: Results for orders placed or performed in visit on 04/13/19  Novel Coronavirus, NAA ([facility])     Status: None   Collection Time: 04/13/19 12:00 AM   Specimen: Nasopharyngeal(NP) swabs in vial transport medium   NASOPHARYNGE  TESTING  Result Value Ref Range Status   SARS-CoV-2, NAA Not Detected Not Detected Final    Comment: This nucleic acid amplification test was developed and its performance characteristics determined by [employer]. Nucleic acid amplification tests include PCR and TMA. This test has not been FDA cleared or approved. This test has been authorized by FDA under an Emergency Use Authorization (EUA). This test is only authorized for the duration of time the declaration that circumstances exist justifying the authorization of the emergency use of in vitro diagnostic tests for detection of SARS-CoV-2 virus and/or diagnosis of COVID-19 infection under section 564(b)(1) of the Act, 21 U.S.C. 639aaa-6(a) (1), unless the authorization is terminated  or revoked sooner. When diagnostic testing is negative, the possibility of a false negative result should be considered in the context of a patient's recent exposures and the presence of clinical signs and symptoms consistent with COVID-19. An individual without symptoms of COVID-19 and who is not shedding SARS-CoV-2 virus would  expect to have a negative (not detected) result in this assay.     Labs: CBC: Recent Labs  Lab 12/29/23 0607 12/30/23 0536  WBC 9.2 8.3  HGB 11.0* 11.0*  HCT 33.7* 33.8*  MCV 86.4 86.7  PLT 372 370   Basic Metabolic Panel: Recent Labs  Lab 12/29/23 0607  NA 137  K 3.5  CL 107  CO2 20*  GLUCOSE 126*  BUN 8  CREATININE 0.77  CALCIUM 8.3*   Liver Function Tests: Recent Labs  Lab 12/29/23 0607  AST 20  ALT 14  ALKPHOS 57  BILITOT 0.7  PROT 6.7  ALBUMIN 3.1*   CBG: No results for input(s): GLUCAP in the last 168 hours.  Discharge time spent: greater than 30 minutes.  This record has been created using Conservation officer, historic buildings. Errors have been sought and corrected,but may not always be located. Such creation errors do not reflect on the standard of care.   Signed: Amaryllis Dare, MD [facility] 12/30/2023

## 2023-12-30 NOTE — TOC Transition Note (Addendum)
 Transition of Care [facility]) - Discharge Note   Patient Details  Name: Linda Meyers MRN: [id number] Date of Birth: [birth year]  [facility] CM/SW Contact:  Rosalva Jon Bloch, RN Phone Number: 12/30/2023, 12:41 PM   Clinical Narrative:    Patient will DC to: home Anticipated DC date: 12/30/2023 Family notified: yes Transport by: car      -s/p -Intramedullary nailing of right tibia fracture 6/30 Per MD patient ready for DC today. RN, patient, and patient's family aware of DC plan. Pt agreeable to home health services. Pt without provider preference. Referral ade with Amy/ [facility] and accepted. Referral made with [facility] for RW. Equipment will be delivered to bedside prior to d/c. Post hospital f/u noted on AVS. Pt without RX med concerns. Family to provide transportation to home.  RNCM will sign off for now as intervention is no longer needed. Please consult us  again if new needs arise.   Final next level of care: Home w Home Health Services Barriers to Discharge: No Barriers Identified   Patient Goals and CMS Choice     Choice offered to / list presented to : Patient      Discharge Placement                       Discharge Plan and Services Additional resources added to the After Visit Summary for                  DME Arranged: Walker rolling DME Agency: [employer] Date DME Agency Contacted: 12/30/23 Time DME Agency Contacted: 1239 Representative spoke with at DME Agency: [facility] HH Arranged: OT, PT HH Agency: [facility] Date [facility] Agency Contacted: 12/30/23 Time HH Agency Contacted: 1240 Representative spoke with at [facility] Agency: Amy  Social Drivers of Health (SDOH) Interventions SDOH Screenings   Food Insecurity: No Food Insecurity (12/28/2023)  Housing: Low Risk  (12/28/2023)  Transportation Needs: No Transportation Needs (12/28/2023)  Utilities: Not At Risk (12/28/2023)  Tobacco Use: Low Risk  (12/28/2023)      Readmission Risk Interventions     No data to display

## 2023-12-30 NOTE — Progress Notes (Signed)
 Physical Therapy Treatment Patient Details Name: Mackensey Bolte MRN: [id number] DOB: [birth year] Today's Date: 12/30/2023   History of Present Illness 48 y.o. female with medical history significant of lupus, rheumatoid arthritis, anxiety, Raynaud's phenomenon, anemia, and L4-L5 and L5-S1 laminotomies with fusion who present with right ankle pain after a fall while roller skating. Underwent IM nailing R tibia fracture 6/30. Non-op management of R distal fibula fx.    PT Comments  Pt performed stair training, review of HEP and gt training.  Minimal cues for safety due to impulsivity.  Pt continues to improve.  Reviewed stair training for safe entry into home today.  HEP  issued.  Pt has no questions.  Left in care of OT to perform ADL training.     If plan is discharge home, recommend the following: Assist for transportation;Help with stairs or ramp for entrance;Assistance with cooking/housework   Can travel by private vehicle        Equipment Recommendations  Rolling walker (2 wheels)    Recommendations for Other Services       Precautions / Restrictions Precautions Precautions: Fall Recall of Precautions/Restrictions: Intact Required Braces or Orthoses: Splint/Cast Splint/Cast: R CAM boot Restrictions Weight Bearing Restrictions Per Provider Order: Yes RLE Weight Bearing Per Provider Order: Non weight bearing Other Position/Activity Restrictions: CAM boot on when OOB     Mobility  Bed Mobility Overal bed mobility: Modified Independent                  Transfers Overall transfer level: Needs assistance Equipment used: Rolling walker (2 wheels) Transfers: Sit to/from Stand Sit to Stand: Supervision           General transfer comment: vc's for hand placement, presents with poor eccentric load and hand placement when returning to seated surface.    Ambulation/Gait Ambulation/Gait assistance: Supervision Gait Distance (Feet): 30 Feet Assistive device: Rolling  walker (2 wheels) Gait Pattern/deviations: Step-to pattern, Trunk flexed Gait velocity: decreased     General Gait Details: Cues for posture and weight bearing, at times noted with TDWB and educated to keep NWB for all aspects   Stairs Stairs: Yes Stairs assistance: Min assist Stair Management: With walker, Backwards, Forwards Number of Stairs: 2 General stair comments: Cues for hand placement and RW placement.  Educated on partner guarding RW to maintain safety.  Pt reports ease with alternative method.   Wheelchair Mobility     Tilt Bed    Modified Rankin (Stroke Patients Only)       Balance Overall balance assessment: Needs assistance   Sitting balance-Leahy Scale: Good Sitting balance - Comments: sitting EOB       Standing balance comment: can safely stand with unilateral UE support with RLE NWB                            Communication Communication Communication: No apparent difficulties  Cognition Arousal: Alert Behavior During Therapy: WFL for tasks assessed/performed, Impulsive   PT - Cognitive impairments: No apparent impairments                         Following commands: Intact      Cueing Cueing Techniques: Verbal cues  Exercises General Exercises - Lower Extremity Quad Sets: AROM, Right, 10 reps, Supine Long Arc Quad: AROM, Right, 10 reps, Supine Heel Slides: AROM, Right, 10 reps, Seated Straight Leg Raises: AROM, Right, 10 reps, Supine, AAROM  General Comments        Pertinent Vitals/Pain Pain Assessment Pain Assessment: Faces Faces Pain Scale: Hurts little more Pain Location: R LE Pain Descriptors / Indicators: Aching, Constant Pain Intervention(s): Monitored during session, Repositioned    Home Living                          Prior Function            PT Goals (current goals can now be found in the care plan section) Acute Rehab PT Goals Patient Stated Goal: return home Potential to Achieve  Goals: Good Progress towards PT goals: Progressing toward goals    Frequency    Min 3X/week      PT Plan      Co-evaluation              AM-PAC PT 6 Clicks Mobility   Outcome Measure  Help needed turning from your back to your side while in a flat bed without using bedrails?: None Help needed moving from lying on your back to sitting on the side of a flat bed without using bedrails?: None Help needed moving to and from a bed to a chair (including a wheelchair)?: None Help needed standing up from a chair using your arms (e.g., wheelchair or bedside chair)?: A Little Help needed to walk in hospital room?: A Little Help needed climbing 3-5 steps with a railing? : A Little 6 Click Score: 21    End of Session Equipment Utilized During Treatment: Gait belt Activity Tolerance: Patient tolerated treatment well Patient left: with call bell/phone within reach;in chair Nurse Communication: Mobility status PT Visit Diagnosis: Pain;Difficulty in walking, not elsewhere classified (R26.2) Pain - Right/Left: Right Pain - part of body: Ankle and joints of foot     Time: 8887-8864 PT Time Calculation (min) (ACUTE ONLY): 23 min  Charges:    $Gait Training: 8-22 mins $Therapeutic Exercise: 8-22 mins PT General Charges $$ ACUTE PT VISIT: 1 Visit                     Toya HAMS , PTA Acute Rehabilitation Services Office [phone]    Toya JINNY Gosling 12/30/2023, 11:54 AM

## 2023-12-30 NOTE — Progress Notes (Signed)
 Occupational Therapy Treatment Patient Details Name: Linda Meyers MRN: [id number] DOB: 02/23/1976 Today's Date: 12/30/2023   History of present illness 48 y.o. female with medical history significant of lupus, rheumatoid arthritis, anxiety, Raynaud's phenomenon, anemia, and L4-L5 and L5-S1 laminotomies with fusion who present with right ankle pain after a fall while roller skating. Underwent IM nailing R tibia fracture 6/30. Non-op management of R distal fibula fx.   OT comments  OT session focused on training in compensatory strategies and techniques for increased safety and independence with functional transfers with a RW and for increased independence and comfort with LB ADLs with use of AE. Pt demonstrates understanding of all training through teach back. Pt currently demonstrates ability to complete ADLs Independent to Contact guard assist and functional transfers/mobility with a RW with Supervision, all while adhering to R LE NWB. Pt participated well in session and is making good progress toward OT goals. Plan for pt to discharge home today. If pt does not discharge as plan, pt will benefit from continued acute skilled OT services to increase safety and independence with functional tasks. No post-acute skilled OT needs are anticipated at this time.       If plan is discharge home, recommend the following:  A little help with walking and/or transfers;A little help with bathing/dressing/bathroom;Assistance with cooking/housework;Assist for transportation;Help with stairs or ramp for entrance   Equipment Recommendations  None recommended by OT    Recommendations for Other Services      Precautions / Restrictions Precautions Precautions: Fall Recall of Precautions/Restrictions: Intact Required Braces or Orthoses: Splint/Cast Splint/Cast: R CAM boot Restrictions Weight Bearing Restrictions Per Provider Order: Yes RLE Weight Bearing Per Provider Order: Non weight bearing Other  Position/Activity Restrictions: CAM boot on when OOB       Mobility Bed Mobility Overal bed mobility: Modified Independent                  Transfers Overall transfer level: Needs assistance Equipment used: Rolling walker (2 wheels) Transfers: Sit to/from Stand, Bed to chair/wheelchair/BSC Sit to Stand: Supervision     Step pivot transfers: Supervision     General transfer comment: Pt with good adherance to R LE NWB throughout session without cues. OT educated pt in techniques for toilet transfer using RW placed over toilet to sit/stand and using lower side bars of RW to push up to stand with RW placed in front of pt sitting on toilet with pt demonstrating understanding of all training through teach back.     Balance Overall balance assessment: Needs assistance Sitting-balance support: No upper extremity supported, Feet supported Sitting balance-Leahy Scale: Good Sitting balance - Comments: sitting EOB   Standing balance support: Single extremity supported, Bilateral upper extremity supported, During functional activity, Reliant on assistive device for balance Standing balance-Leahy Scale: Fair Standing balance comment: can safely stand with unilateral UE support with RLE NWB                           ADL either performed or assessed with clinical judgement   ADL Overall ADL's : Needs assistance/impaired     Grooming: Supervision/safety;Standing (adhering to R LE NWB)           Upper Body Dressing : Set up;Sitting   Lower Body Dressing: Supervision/safety;Contact guard assist;With adaptive equipment;Cueing for compensatory techniques;Sitting/lateral leans;Sit to/from stand (adhering to R LE NWB)   Toilet Transfer: Supervision/safety;Ambulation;Rolling walker (2 wheels);Regular Toilet (adhering to R LE NWB)  Toileting- Clothing Manipulation and Hygiene: Supervision/safety;Sitting/lateral lean;Sit to/from stand (adhering to R LE NWB)   Tub/ Shower  Transfer: Supervision/safety;Ambulation;Shower seat;Rolling walker (2 wheels) (adhering to R LE NWB) Tub/Shower Transfer Details (indicate cue type and reason): simulated at chair Functional mobility during ADLs: Supervision/safety;Rolling walker (2 wheels) (adhering to R LE NWB) General ADL Comments: OT educated pt in use of AE for improved independence and comfort with LB ADLs and in techniques for toilet transfer using RW placed over toilet to sit/stand and using lower side bars of RW to push up to stand with RW placed in front of pt sitting on toilet with pt demonstrating understanding of all training through teach back. OT and pt also discussed availablity of non-skid tape or decals to place on floor of walk-in shower to decrease risk of falls with pt verbalizing understanding.    Extremity/Trunk Assessment Upper Extremity Assessment Upper Extremity Assessment: Right hand dominant;Overall [facility] for tasks assessed   Lower Extremity Assessment Lower Extremity Assessment: Defer to PT evaluation        Vision       Perception     Praxis     Communication Communication Communication: No apparent difficulties   Cognition Arousal: Alert Behavior During Therapy: WFL for tasks assessed/performed, Impulsive Cognition: No apparent impairments             OT - Cognition Comments: Pt AAOx4 and pleasnat throughout session. Pt demonstrates good safety awareness, good insight into deficits, and good carryover to training in precautions and use of R LE brace.                 Following commands: Intact        Cueing   Cueing Techniques: Verbal cues, Visual cues  Exercises      Shoulder Instructions       General Comments VSS on RA throughout session    Pertinent Vitals/ Pain       Pain Assessment Pain Assessment: Faces Faces Pain Scale: Hurts little more Pain Location: R LE Pain Descriptors / Indicators: Aching, Constant, Guarding Pain Intervention(s): Limited  activity within patient's tolerance, Monitored during session, Repositioned  Home Living                                          Prior Functioning/Environment              Frequency  Min 2X/week        Progress Toward Goals  OT Goals(current goals can now be found in the care plan section)  Progress towards OT goals: Progressing toward goals  Acute Rehab OT Goals Patient Stated Goal: to return home and to have [facility] PT  Plan      Co-evaluation                 AM-PAC OT 6 Clicks Daily Activity     Outcome Measure   Help from another person eating meals?: None Help from another person taking care of personal grooming?: A Little Help from another person toileting, which includes using toliet, bedpan, or urinal?: A Little Help from another person bathing (including washing, rinsing, drying)?: A Little Help from another person to put on and taking off regular upper body clothing?: None Help from another person to put on and taking off regular lower body clothing?: A Little 6 Click Score: 20    End of Session  Equipment Utilized During Treatment: Rolling walker (2 wheels);Other (comment);Gait belt (sock aide, reacher, long handled shoe horn, long handled sponge)  OT Visit Diagnosis: Unsteadiness on feet (R26.81)   Activity Tolerance Patient tolerated treatment well   Patient Left in bed;with call bell/phone within reach   Nurse Communication Mobility status        Time: 8867-8788 OT Time Calculation (min): 39 min  Charges: OT General Charges $OT Visit: 1 Visit OT Treatments $Self Care/Home Management : 38-52 mins  Margarie Rockey HERO., OTR/L, MA Acute Rehab [phone]   Margarie FORBES Horns 12/30/2023, 1:37 PM

## 2024-01-02 ENCOUNTER — Telehealth: Payer: Self-pay | Admitting: Emergency Medicine

## 2024-01-02 NOTE — Telephone Encounter (Signed)
 Patient left message of nausea/vomiting with pain medicine. Attempted 2 calls, and left voice message.  Zofran  ODT 4mg  tablets (#20 tabs) called into [facility] on [address] at 08:09 am called in to pharmacy via voice message. Informed patient of this and told to call back if need anything else.   9187: Patient called back, okay with zofran , requesting different narcotic refill.  Lauraine Moores PA-C notified.

## 2024-01-13 ENCOUNTER — Other Ambulatory Visit (HOSPITAL_COMMUNITY): Payer: Self-pay

## 2024-03-15 ENCOUNTER — Other Ambulatory Visit (HOSPITAL_BASED_OUTPATIENT_CLINIC_OR_DEPARTMENT_OTHER): Payer: Self-pay | Admitting: Student

## 2024-03-15 DIAGNOSIS — M79604 Pain in right leg: Secondary | ICD-10-CM

## 2024-03-17 ENCOUNTER — Ambulatory Visit (HOSPITAL_BASED_OUTPATIENT_CLINIC_OR_DEPARTMENT_OTHER)
Admission: RE | Admit: 2024-03-17 | Discharge: 2024-03-17 | Disposition: A | Discharge status: Home / Self Care | Source: Ambulatory Visit | Attending: Student | Admitting: Student

## 2024-03-17 DIAGNOSIS — M79604 Pain in right leg: Secondary | ICD-10-CM

## 2024-03-17 DIAGNOSIS — M79605 Pain in left leg: Secondary | ICD-10-CM | POA: Insufficient documentation

## 2024-03-18 ENCOUNTER — Ambulatory Visit: Payer: Self-pay | Admitting: Student

## 2024-03-22 NOTE — Progress Notes (Incomplete)
 SDW CALL  Patient was given pre-op instructions over the phone. The opportunity was given for the patient to ask questions. No further questions asked. Patient verbalized understanding of instructions given.   PCP - Haze Kingfisher, MD Cardiologist -   PPM/ICD -  Device Orders -  Rep Notified -   Chest x-ray -  EKG - 06/09/23 Stress Test -  ECHO -  Cardiac Cath -   Sleep Study -  CPAP -   Fasting Blood Sugar -  Checks Blood Sugar _____ times a day  Blood Thinner Instructions: Aspirin  Instructions:  ERAS Protcol -NPO PRE-SURGERY Ensure or G2-   COVID TEST- na   Anesthesia review:   Patient denies shortness of breath, fever, cough and chest pain over the phone call   Special instructions:    Oral Hygiene is also important to reduce your risk of infection.  Remember - BRUSH YOUR TEETH THE MORNING OF SURGERY WITH YOUR REGULAR TOOTHPASTE

## 2024-03-22 NOTE — H&P (Incomplete)
 Orthopaedic Trauma Service (OTS) Consult   Patient ID: Linda Meyers MRN: [id number] DOB/AGE: [birth year] 48 y.o.  Reason for Consult:*** Referring Provider: ***  HPI: Linda Meyers is a 48 y.o. female ***  Past Medical History:  Diagnosis Date   Anemia    Anxiety    Arthritis    Depression    Lupus (systemic lupus erythematosus) (HCC) 1999   Past Surgical History:  Procedure Laterality Date   TIBIA IM NAIL INSERTION Right 12/28/2023   Procedure: INSERTION, INTRAMEDULLARY ROD, TIBIA;  Surgeon: Kendal Franky SQUIBB, MD;  Location: [facility] OR;  Service: Orthopedics;  Laterality: Right;   vulva vaporization  10/2015   No family history on file.  Social History:  reports that she has never smoked. She has never used smokeless tobacco. She reports current alcohol  use. She reports current drug use. Frequency: 7.00 times per week. Drug: Marijuana.  Allergies:  Allergies  Allergen Reactions   Morphine Itching and Nausea And Vomiting    Patient prefers no opioid medication   Propoxyphene Nausea And Vomiting    Medications: Prior to Admission medications   Medication Sig Start Date End Date Taking? Authorizing Provider  busPIRone  (BUSPAR ) 7.5 MG tablet Take 7.5 mg by mouth 2 (two) times daily. 10/26/23   [provider]  cholecalciferol  (VITAMIN D ) 1000 units tablet Take 1,000 Units by mouth every evening.    [provider]  diphenhydramine -acetaminophen  (TYLENOL  PM) 25-500 MG TABS tablet Take 1 tablet by mouth at bedtime as needed (for pain).    [provider]  imiquimod  (ALDARA ) 5 % cream Apply 1 Application topically 3 (three) times a week. Every mon, wed, and fri 11/06/23   [provider]  LINZESS  72 MCG capsule Take 72 mcg by mouth daily as needed (constipation). 10/05/23   [provider]  oxyCODONE  (OXY IR/ROXICODONE ) 5 MG immediate release tablet Take 1 tablet (5 mg total) by mouth every 4 (four) hours as needed for severe pain (pain score  7-10). 12/30/23   Danton Lauraine LABOR, PA-C  polyethylene glycol powder (GLYCOLAX /MIRALAX ) 17 GM/SCOOP powder Take 1 capful (17 g) with water by mouth daily as needed for mild constipation. 12/30/23   Amin, Sumayya, MD  spironolactone  (ALDACTONE ) 100 MG tablet Take 100 mg by mouth daily. 10/26/23   [provider]  tiZANidine  (ZANAFLEX ) 4 MG tablet Take 1 tablet (4 mg total) by mouth every 8 (eight) hours as needed for muscle spasms. 12/30/23   Danton Lauraine LABOR, PA-C  VALTREX 1 g tablet Take 1 g by mouth daily as needed (for outbreaks). 08/04/16   [provider]  vitamin A  8000 UNIT capsule Take 8,000 Units by mouth every evening.    [provider]  vitamin C  (ASCORBIC ACID ) 500 MG tablet Take 500 mg by mouth every evening.    [provider]   {medication reviewed/display:[id number]}  Positive ROS: All other systems have been reviewed and were otherwise negative with the exception of those mentioned in the HPI and as above.  Exam: There were no vitals taken for this visit. General: Alert and oriented, no acute distress Cardiovascular: No pedal edema Respiratory: No cyanosis, no use of accessory musculature GI: No organomegaly, abdomen is soft and non-tender Skin: No lesions in the area of chief complaint Neurologic: Sensation intact distally Psychiatric: Patient is competent for consent with normal mood and affect  Musculoskeletal: Injured Extremity (CV, lymph, sensation, reflexes): ***  ***Skin without lesions. No tenderness to palpation. Full painless ROM, full strength in each  muscle group without evidence of instability. Motor/sensory function at baseline. Neurovascularly intact.   Medical Decision Making: Data: Imaging: CT scan right tibia show intramedullary nail in place.  The proximal portion of the tibia is rotated about 15 degrees.  There has been some early callus formation around the fracture.  No signs of any hardware failure or loosening.  Labs:  No results found for this or any previous visit (from the past 24 hours).  Medical history and chart was reviewed and case discussed with attending provider.  Assessment/Plan: 48 year old female s/p IMN R distal tibia fracture 12/28/2023  Most recent CT scan showed about 15 degrees of rotation in patient's tibia.  This is caused her right foot to be externally rotated when ambulating.  At this point, I would recommend proceeding with adjustment of her current hardware versus removal and reimplantation of a new intramedullary rod to the right tibia to correct the alignment.  Risks and benefits of the procedure have been discussed with the patient. Risks discussed included bleeding, infection, malunion, nonunion, damage to surrounding nerves and blood vessels, pain, hardware prominence or irritation, hardware failure, stiffness, fracture or malalignment., DVT/PE, compartment syndrome, and even anesthesia complications.  Patient states understanding these risks and agrees to proceed with surgery.  Consent will be obtained.  Will plan discharge patient over the PACU postoperatively and allow her to continue weightbearing as tolerated on the right lower extremity.  Lauraine PATRIC Moores PA-C [facility] [phone] (office) [email or web address]

## 2024-03-22 NOTE — Progress Notes (Signed)
 Called patient to give pre op instructions. Pt stated that she does not think she wants to have the surgery tomorrow. She has an appointment with Dr. Kendal today and will make a decision at that time. I gave her my office number([phone]) and asked her to call me back by 6:30pm to review instructions if she does decide to have surgery tomorrow. I informed her that she needs to be at [facility] at 5:30am and nothing to eat or drink after midnight. Patient stated that she would call back if she decides to have surgery tomorrow.

## 2024-03-23 ENCOUNTER — Ambulatory Visit (HOSPITAL_COMMUNITY): Admission: RE | Admit: 2024-03-23 | Source: Home / Self Care | Admitting: Student

## 2024-03-23 SURGERY — INSERTION, INTRAMEDULLARY ROD, TIBIA
Anesthesia: General | Laterality: Right

## 2024-03-30 ENCOUNTER — Other Ambulatory Visit: Payer: Self-pay | Admitting: Orthopedic Surgery

## 2024-03-30 DIAGNOSIS — M25571 Pain in right ankle and joints of right foot: Secondary | ICD-10-CM

## 2024-04-06 ENCOUNTER — Ambulatory Visit
Admission: RE | Admit: 2024-04-06 | Discharge: 2024-04-06 | Disposition: A | Discharge status: Home / Self Care | Source: Ambulatory Visit | Attending: Orthopedic Surgery | Admitting: Orthopedic Surgery

## 2024-04-06 DIAGNOSIS — M25571 Pain in right ankle and joints of right foot: Secondary | ICD-10-CM
